# Patient Record
Sex: Male | Born: 1955 | Race: White | Hispanic: No | Marital: Single | Smoking: Current every day smoker
Health system: Southern US, Community
[De-identification: ages and names within clinical notes are randomized; demographics above are authoritative.]

## PROBLEM LIST (undated history)

## (undated) DIAGNOSIS — D3A026 Benign carcinoid tumor of the rectum: Secondary | ICD-10-CM

## (undated) DIAGNOSIS — E785 Hyperlipidemia, unspecified: Secondary | ICD-10-CM

## (undated) DIAGNOSIS — I714 Abdominal aortic aneurysm, without rupture: Secondary | ICD-10-CM

## (undated) DIAGNOSIS — I10 Essential (primary) hypertension: Secondary | ICD-10-CM

## (undated) DIAGNOSIS — T884XXA Failed or difficult intubation, initial encounter: Secondary | ICD-10-CM

## (undated) DIAGNOSIS — E559 Vitamin D deficiency, unspecified: Secondary | ICD-10-CM

---

## 2011-04-30 DIAGNOSIS — D3A026 Benign carcinoid tumor of the rectum: Secondary | ICD-10-CM

## 2011-04-30 HISTORY — DX: Benign carcinoid tumor of the rectum: D3A.026

## 2013-05-27 DIAGNOSIS — E785 Hyperlipidemia, unspecified: Secondary | ICD-10-CM

## 2013-05-27 DIAGNOSIS — E559 Vitamin D deficiency, unspecified: Secondary | ICD-10-CM

## 2013-05-27 HISTORY — DX: Vitamin D deficiency, unspecified: E55.9

## 2013-05-27 HISTORY — DX: Hyperlipidemia, unspecified: E78.5

## 2016-09-21 ENCOUNTER — Inpatient Hospital Stay (HOSPITAL_COMMUNITY): Payer: BLUE CROSS/BLUE SHIELD | Admitting: Anesthesiology

## 2016-09-21 ENCOUNTER — Emergency Department (HOSPITAL_COMMUNITY): Payer: BLUE CROSS/BLUE SHIELD

## 2016-09-21 ENCOUNTER — Inpatient Hospital Stay (HOSPITAL_COMMUNITY)
Admission: EM | Admit: 2016-09-21 | Discharge: 2016-10-08 | DRG: 329 | Disposition: A | Discharge status: Home / Self Care | Payer: BLUE CROSS/BLUE SHIELD | Attending: Surgery | Admitting: Surgery

## 2016-09-21 ENCOUNTER — Encounter (HOSPITAL_COMMUNITY): Admission: EM | Disposition: A | Discharge status: Home / Self Care | Payer: Self-pay | Source: Home / Self Care

## 2016-09-21 ENCOUNTER — Encounter (HOSPITAL_COMMUNITY): Payer: Self-pay

## 2016-09-21 DIAGNOSIS — T884XXA Failed or difficult intubation, initial encounter: Secondary | ICD-10-CM | POA: Diagnosis not present

## 2016-09-21 DIAGNOSIS — T8149XA Infection following a procedure, other surgical site, initial encounter: Secondary | ICD-10-CM

## 2016-09-21 DIAGNOSIS — K5900 Constipation, unspecified: Secondary | ICD-10-CM | POA: Diagnosis present

## 2016-09-21 DIAGNOSIS — T8143XA Infection following a procedure, organ and space surgical site, initial encounter: Secondary | ICD-10-CM

## 2016-09-21 DIAGNOSIS — Z85048 Personal history of other malignant neoplasm of rectum, rectosigmoid junction, and anus: Secondary | ICD-10-CM | POA: Diagnosis not present

## 2016-09-21 DIAGNOSIS — K651 Peritoneal abscess: Secondary | ICD-10-CM | POA: Diagnosis not present

## 2016-09-21 DIAGNOSIS — K640 First degree hemorrhoids: Secondary | ICD-10-CM | POA: Diagnosis present

## 2016-09-21 DIAGNOSIS — I10 Essential (primary) hypertension: Secondary | ICD-10-CM | POA: Diagnosis present

## 2016-09-21 DIAGNOSIS — J90 Pleural effusion, not elsewhere classified: Secondary | ICD-10-CM | POA: Diagnosis not present

## 2016-09-21 DIAGNOSIS — J189 Pneumonia, unspecified organism: Secondary | ICD-10-CM | POA: Diagnosis not present

## 2016-09-21 DIAGNOSIS — R197 Diarrhea, unspecified: Secondary | ICD-10-CM | POA: Diagnosis not present

## 2016-09-21 DIAGNOSIS — B9689 Other specified bacterial agents as the cause of diseases classified elsewhere: Secondary | ICD-10-CM | POA: Diagnosis not present

## 2016-09-21 DIAGNOSIS — Q438 Other specified congenital malformations of intestine: Secondary | ICD-10-CM

## 2016-09-21 DIAGNOSIS — Z79899 Other long term (current) drug therapy: Secondary | ICD-10-CM

## 2016-09-21 DIAGNOSIS — I739 Peripheral vascular disease, unspecified: Secondary | ICD-10-CM | POA: Diagnosis present

## 2016-09-21 DIAGNOSIS — Z9889 Other specified postprocedural states: Secondary | ICD-10-CM

## 2016-09-21 DIAGNOSIS — K3589 Other acute appendicitis: Secondary | ICD-10-CM | POA: Diagnosis not present

## 2016-09-21 DIAGNOSIS — J9811 Atelectasis: Secondary | ICD-10-CM | POA: Diagnosis not present

## 2016-09-21 DIAGNOSIS — K353 Acute appendicitis with localized peritonitis, without perforation or gangrene: Secondary | ICD-10-CM

## 2016-09-21 DIAGNOSIS — Z888 Allergy status to other drugs, medicaments and biological substances status: Secondary | ICD-10-CM | POA: Diagnosis not present

## 2016-09-21 DIAGNOSIS — Y95 Nosocomial condition: Secondary | ICD-10-CM | POA: Diagnosis not present

## 2016-09-21 DIAGNOSIS — K641 Second degree hemorrhoids: Secondary | ICD-10-CM | POA: Diagnosis present

## 2016-09-21 DIAGNOSIS — K219 Gastro-esophageal reflux disease without esophagitis: Secondary | ICD-10-CM | POA: Diagnosis present

## 2016-09-21 DIAGNOSIS — K4090 Unilateral inguinal hernia, without obstruction or gangrene, not specified as recurrent: Secondary | ICD-10-CM | POA: Diagnosis present

## 2016-09-21 DIAGNOSIS — D72829 Elevated white blood cell count, unspecified: Secondary | ICD-10-CM

## 2016-09-21 DIAGNOSIS — K567 Ileus, unspecified: Secondary | ICD-10-CM | POA: Diagnosis not present

## 2016-09-21 DIAGNOSIS — R12 Heartburn: Secondary | ICD-10-CM

## 2016-09-21 DIAGNOSIS — D3A026 Benign carcinoid tumor of the rectum: Secondary | ICD-10-CM | POA: Diagnosis present

## 2016-09-21 DIAGNOSIS — L7632 Postprocedural hematoma of skin and subcutaneous tissue following other procedure: Secondary | ICD-10-CM | POA: Diagnosis present

## 2016-09-21 DIAGNOSIS — K5909 Other constipation: Secondary | ICD-10-CM

## 2016-09-21 DIAGNOSIS — R110 Nausea: Secondary | ICD-10-CM | POA: Diagnosis not present

## 2016-09-21 DIAGNOSIS — R509 Fever, unspecified: Secondary | ICD-10-CM

## 2016-09-21 DIAGNOSIS — D72828 Other elevated white blood cell count: Secondary | ICD-10-CM

## 2016-09-21 DIAGNOSIS — E785 Hyperlipidemia, unspecified: Secondary | ICD-10-CM | POA: Diagnosis present

## 2016-09-21 DIAGNOSIS — I714 Abdominal aortic aneurysm, without rupture, unspecified: Secondary | ICD-10-CM

## 2016-09-21 DIAGNOSIS — R1013 Epigastric pain: Secondary | ICD-10-CM | POA: Diagnosis present

## 2016-09-21 DIAGNOSIS — F17210 Nicotine dependence, cigarettes, uncomplicated: Secondary | ICD-10-CM | POA: Diagnosis present

## 2016-09-21 DIAGNOSIS — K3580 Unspecified acute appendicitis: Secondary | ICD-10-CM

## 2016-09-21 DIAGNOSIS — Z720 Tobacco use: Secondary | ICD-10-CM

## 2016-09-21 HISTORY — DX: Abdominal aortic aneurysm, without rupture: I71.4

## 2016-09-21 HISTORY — DX: Essential (primary) hypertension: I10

## 2016-09-21 HISTORY — DX: Benign carcinoid tumor of the rectum: D3A.026

## 2016-09-21 HISTORY — DX: Vitamin D deficiency, unspecified: E55.9

## 2016-09-21 HISTORY — DX: Hyperlipidemia, unspecified: E78.5

## 2016-09-21 HISTORY — DX: Failed or difficult intubation, initial encounter: T88.4XXA

## 2016-09-21 HISTORY — PX: LAPAROSCOPIC APPENDECTOMY: SHX408

## 2016-09-21 HISTORY — DX: Abdominal aortic aneurysm, without rupture, unspecified: I71.40

## 2016-09-21 LAB — CBC WITH DIFFERENTIAL/PLATELET
BASOS ABS: 0.1 10*3/uL (ref 0.0–0.1)
BASOS PCT: 1 %
EOS ABS: 0.2 10*3/uL (ref 0.0–0.7)
Eosinophils Relative: 1 %
HEMATOCRIT: 39.5 % (ref 39.0–52.0)
HEMOGLOBIN: 13.6 g/dL (ref 13.0–17.0)
Lymphocytes Relative: 16 %
Lymphs Abs: 3.2 10*3/uL (ref 0.7–4.0)
MCH: 31 pg (ref 26.0–34.0)
MCHC: 34.4 g/dL (ref 30.0–36.0)
MCV: 90 fL (ref 78.0–100.0)
MONOS PCT: 7 %
Monocytes Absolute: 1.4 10*3/uL — ABNORMAL HIGH (ref 0.1–1.0)
NEUTROS ABS: 15.5 10*3/uL — AB (ref 1.7–7.7)
NEUTROS PCT: 77 %
Platelets: 337 10*3/uL (ref 150–400)
RBC: 4.39 MIL/uL (ref 4.22–5.81)
RDW: 12.9 % (ref 11.5–15.5)
WBC: 20.2 10*3/uL — AB (ref 4.0–10.5)

## 2016-09-21 LAB — COMPREHENSIVE METABOLIC PANEL
ALBUMIN: 4.2 g/dL (ref 3.5–5.0)
ALK PHOS: 95 U/L (ref 38–126)
ALT: 20 U/L (ref 17–63)
ANION GAP: 7 (ref 5–15)
AST: 27 U/L (ref 15–41)
BILIRUBIN TOTAL: 0.8 mg/dL (ref 0.3–1.2)
BUN: 13 mg/dL (ref 6–20)
CALCIUM: 8.7 mg/dL — AB (ref 8.9–10.3)
CO2: 26 mmol/L (ref 22–32)
Chloride: 104 mmol/L (ref 101–111)
Creatinine, Ser: 1.02 mg/dL (ref 0.61–1.24)
GFR calc Af Amer: >60 mL/min (ref >60)
GFR calc non Af Amer: >60 mL/min (ref >60)
GLUCOSE: 149 mg/dL — AB (ref 65–99)
Potassium: 4 mmol/L (ref 3.5–5.1)
SODIUM: 137 mmol/L (ref 135–145)
Total Protein: 7.1 g/dL (ref 6.5–8.1)

## 2016-09-21 LAB — TROPONIN I

## 2016-09-21 LAB — LIPASE, BLOOD: Lipase: 30 U/L (ref 11–51)

## 2016-09-21 LAB — I-STAT CG4 LACTIC ACID, ED: Lactic Acid, Venous: 1.29 mmol/L (ref 0.5–1.9)

## 2016-09-21 SURGERY — APPENDECTOMY, LAPAROSCOPIC
Anesthesia: General | Site: Abdomen

## 2016-09-21 MED ORDER — DEXTROSE 5 % IV SOLN
2.0000 g | INTRAVENOUS | Status: AC
  Administered 2016-09-21: 2 g via INTRAVENOUS
  Filled 2016-09-21: qty 2

## 2016-09-21 MED ORDER — LACTATED RINGERS IV BOLUS (SEPSIS)
1000.0000 mL | Freq: Once | INTRAVENOUS | Status: AC
  Administered 2016-09-21: 1000 mL via INTRAVENOUS

## 2016-09-21 MED ORDER — BUPIVACAINE HCL (PF) 0.5 % IJ SOLN
INTRAMUSCULAR | Status: AC
  Filled 2016-09-21: qty 30

## 2016-09-21 MED ORDER — IOPAMIDOL (ISOVUE-300) INJECTION 61%
100.0000 mL | Freq: Once | INTRAVENOUS | Status: AC | PRN
  Administered 2016-09-21: 100 mL via INTRAVENOUS

## 2016-09-21 MED ORDER — HYDRALAZINE HCL 20 MG/ML IJ SOLN: 5.0000 mg | Freq: Four times a day (QID) | INTRAMUSCULAR | Status: DC | PRN

## 2016-09-21 MED ORDER — SODIUM CHLORIDE 0.9 % IV BOLUS (SEPSIS)
1000.0000 mL | Freq: Once | INTRAVENOUS | Status: AC
  Administered 2016-09-21: 1000 mL via INTRAVENOUS

## 2016-09-21 MED ORDER — PROPOFOL 10 MG/ML IV BOLUS
INTRAVENOUS | Status: AC
  Filled 2016-09-21: qty 20

## 2016-09-21 MED ORDER — PROPOFOL 10 MG/ML IV BOLUS
INTRAVENOUS | Status: DC | PRN
  Administered 2016-09-21: 200 mg via INTRAVENOUS

## 2016-09-21 MED ORDER — METOCLOPRAMIDE HCL 5 MG/ML IJ SOLN: 5.0000 mg | Freq: Four times a day (QID) | INTRAMUSCULAR | Status: DC | PRN

## 2016-09-21 MED ORDER — ONDANSETRON HCL 4 MG/2ML IJ SOLN
INTRAMUSCULAR | Status: DC | PRN
  Administered 2016-09-21: 4 mg via INTRAVENOUS

## 2016-09-21 MED ORDER — HYDROMORPHONE HCL 1 MG/ML IJ SOLN: 0.2500 mg | INTRAMUSCULAR | Status: DC | PRN

## 2016-09-21 MED ORDER — PROCHLORPERAZINE EDISYLATE 5 MG/ML IJ SOLN: 5.0000 mg | INTRAMUSCULAR | Status: DC | PRN

## 2016-09-21 MED ORDER — LIDOCAINE 2% (20 MG/ML) 5 ML SYRINGE
INTRAMUSCULAR | Status: DC | PRN
  Administered 2016-09-21: 100 mg via INTRAVENOUS

## 2016-09-21 MED ORDER — DIPHENHYDRAMINE HCL 12.5 MG/5ML PO ELIX: 12.5000 mg | ORAL_SOLUTION | Freq: Four times a day (QID) | ORAL | Status: DC | PRN

## 2016-09-21 MED ORDER — DEXAMETHASONE SODIUM PHOSPHATE 10 MG/ML IJ SOLN
INTRAMUSCULAR | Status: DC | PRN
  Administered 2016-09-21: 10 mg via INTRAVENOUS

## 2016-09-21 MED ORDER — SIMETHICONE 80 MG PO CHEW: 40.0000 mg | CHEWABLE_TABLET | Freq: Four times a day (QID) | ORAL | Status: DC | PRN

## 2016-09-21 MED ORDER — CEFTRIAXONE SODIUM 2 G IJ SOLR
2.0000 g | INTRAMUSCULAR | Status: DC
  Administered 2016-09-21: 2 g via INTRAVENOUS
  Filled 2016-09-21 (×3): qty 2

## 2016-09-21 MED ORDER — SODIUM CHLORIDE 0.9 % IV SOLN: 8.0000 mg | Freq: Four times a day (QID) | INTRAVENOUS | Status: DC | PRN

## 2016-09-21 MED ORDER — PHENYLEPHRINE 40 MCG/ML (10ML) SYRINGE FOR IV PUSH (FOR BLOOD PRESSURE SUPPORT)
PREFILLED_SYRINGE | INTRAVENOUS | Status: DC | PRN
  Administered 2016-09-21 (×2): 80 ug via INTRAVENOUS

## 2016-09-21 MED ORDER — SODIUM CHLORIDE 0.9 % IJ SOLN
INTRAMUSCULAR | Status: AC
  Filled 2016-09-21: qty 50

## 2016-09-21 MED ORDER — OXYCODONE HCL 5 MG PO TABS
5.0000 mg | ORAL_TABLET | ORAL | Status: DC | PRN
  Administered 2016-09-22: 10 mg via ORAL
  Administered 2016-09-22 (×2): 5 mg via ORAL
  Administered 2016-09-22 – 2016-09-27 (×9): 10 mg via ORAL
  Administered 2016-09-30 – 2016-10-02 (×7): 5 mg via ORAL
  Administered 2016-10-02 – 2016-10-03 (×4): 10 mg via ORAL
  Administered 2016-10-03 – 2016-10-04 (×4): 5 mg via ORAL
  Administered 2016-10-05 – 2016-10-07 (×9): 10 mg via ORAL
  Administered 2016-10-07: 5 mg via ORAL
  Administered 2016-10-07 – 2016-10-08 (×3): 10 mg via ORAL
  Filled 2016-09-21 (×15): qty 2
  Filled 2016-09-21: qty 1
  Filled 2016-09-21 (×3): qty 2
  Filled 2016-09-21 (×2): qty 1
  Filled 2016-09-21: qty 2
  Filled 2016-09-21: qty 1
  Filled 2016-09-21: qty 2
  Filled 2016-09-21: qty 1
  Filled 2016-09-21: qty 2
  Filled 2016-09-21 (×2): qty 1
  Filled 2016-09-21: qty 2
  Filled 2016-09-21 (×3): qty 1
  Filled 2016-09-21: qty 2
  Filled 2016-09-21: qty 1
  Filled 2016-09-21: qty 2
  Filled 2016-09-21: qty 1
  Filled 2016-09-21: qty 2
  Filled 2016-09-21: qty 1
  Filled 2016-09-21 (×2): qty 2
  Filled 2016-09-21: qty 1

## 2016-09-21 MED ORDER — SUGAMMADEX SODIUM 200 MG/2ML IV SOLN
INTRAVENOUS | Status: DC | PRN
  Administered 2016-09-21: 200 mg via INTRAVENOUS

## 2016-09-21 MED ORDER — ACETAMINOPHEN 325 MG PO TABS: 325.0000 mg | ORAL_TABLET | Freq: Four times a day (QID) | ORAL | Status: DC | PRN

## 2016-09-21 MED ORDER — MAGIC MOUTHWASH: 15.0000 mL | Freq: Four times a day (QID) | ORAL | Status: DC | PRN

## 2016-09-21 MED ORDER — ROCURONIUM BROMIDE 10 MG/ML (PF) SYRINGE
PREFILLED_SYRINGE | INTRAVENOUS | Status: DC | PRN
  Administered 2016-09-21: 40 mg via INTRAVENOUS

## 2016-09-21 MED ORDER — LIDOCAINE 2% (20 MG/ML) 5 ML SYRINGE
INTRAMUSCULAR | Status: AC
  Filled 2016-09-21: qty 5

## 2016-09-21 MED ORDER — ONDANSETRON HCL 4 MG/2ML IJ SOLN
4.0000 mg | Freq: Four times a day (QID) | INTRAMUSCULAR | Status: DC | PRN
Start: 2016-09-21 — End: 2016-10-08

## 2016-09-21 MED ORDER — CEFOTETAN DISODIUM-DEXTROSE 2-2.08 GM-% IV SOLR
INTRAVENOUS | Status: AC
  Filled 2016-09-21: qty 50

## 2016-09-21 MED ORDER — SODIUM CHLORIDE 0.9 % IV SOLN
Freq: Once | INTRAVENOUS | Status: AC
  Administered 2016-09-21: 11:00:00 via INTRAVENOUS

## 2016-09-21 MED ORDER — SUCCINYLCHOLINE CHLORIDE 200 MG/10ML IV SOSY
PREFILLED_SYRINGE | INTRAVENOUS | Status: DC | PRN
Start: 2016-09-21 — End: 2016-09-21
  Administered 2016-09-21: 140 mg via INTRAVENOUS
  Administered 2016-09-21: 60 mg via INTRAVENOUS

## 2016-09-21 MED ORDER — DEXAMETHASONE SODIUM PHOSPHATE 10 MG/ML IJ SOLN
INTRAMUSCULAR | Status: AC
  Filled 2016-09-21: qty 1

## 2016-09-21 MED ORDER — CELECOXIB 200 MG PO CAPS: 400.0000 mg | ORAL_CAPSULE | ORAL | Status: DC

## 2016-09-21 MED ORDER — ONDANSETRON HCL 4 MG/2ML IJ SOLN
INTRAMUSCULAR | Status: AC
  Filled 2016-09-21: qty 2

## 2016-09-21 MED ORDER — SODIUM CHLORIDE 0.9 % IV SOLN: 250.0000 mL | INTRAVENOUS | Status: DC | PRN

## 2016-09-21 MED ORDER — HYDROMORPHONE HCL 1 MG/ML IJ SOLN
0.5000 mg | INTRAMUSCULAR | Status: DC | PRN
  Administered 2016-09-21: 1 mg via INTRAVENOUS
  Administered 2016-09-24: 2 mg via INTRAVENOUS
  Administered 2016-09-25 – 2016-09-26 (×4): 1 mg via INTRAVENOUS
  Filled 2016-09-21 (×3): qty 1
  Filled 2016-09-21: qty 2
  Filled 2016-09-21: qty 1
  Filled 2016-09-21: qty 2

## 2016-09-21 MED ORDER — ONDANSETRON 4 MG PO TBDP: 4.0000 mg | ORAL_TABLET | Freq: Four times a day (QID) | ORAL | Status: DC | PRN

## 2016-09-21 MED ORDER — NAPROXEN 500 MG PO TABS: 500.0000 mg | ORAL_TABLET | Freq: Two times a day (BID) | ORAL | 1 refills | Status: AC | PRN

## 2016-09-21 MED ORDER — NICOTINE 21 MG/24HR TD PT24
21.0000 mg | MEDICATED_PATCH | Freq: Every day | TRANSDERMAL | Status: DC
  Administered 2016-09-22 – 2016-10-08 (×17): 21 mg via TRANSDERMAL
  Filled 2016-09-21 (×17): qty 1

## 2016-09-21 MED ORDER — MENTHOL 3 MG MT LOZG: 1.0000 | LOZENGE | OROMUCOSAL | Status: DC | PRN

## 2016-09-21 MED ORDER — BISACODYL 10 MG RE SUPP: 10.0000 mg | Freq: Two times a day (BID) | RECTAL | Status: DC | PRN

## 2016-09-21 MED ORDER — GABAPENTIN 300 MG PO CAPS
300.0000 mg | ORAL_CAPSULE | ORAL | Status: AC
  Administered 2016-09-21: 300 mg via ORAL
  Filled 2016-09-21 (×2): qty 1

## 2016-09-21 MED ORDER — ACETAMINOPHEN 650 MG RE SUPP: 650.0000 mg | Freq: Four times a day (QID) | RECTAL | Status: DC | PRN

## 2016-09-21 MED ORDER — SODIUM CHLORIDE 0.9% FLUSH
3.0000 mL | Freq: Two times a day (BID) | INTRAVENOUS | Status: DC
  Administered 2016-09-22 – 2016-09-27 (×2): 3 mL via INTRAVENOUS

## 2016-09-21 MED ORDER — NAPROXEN 500 MG PO TABS: 500.0000 mg | ORAL_TABLET | Freq: Two times a day (BID) | ORAL | Status: DC | PRN

## 2016-09-21 MED ORDER — METOPROLOL TARTRATE 5 MG/5ML IV SOLN: 5.0000 mg | Freq: Four times a day (QID) | INTRAVENOUS | Status: DC | PRN

## 2016-09-21 MED ORDER — LACTATED RINGERS IR SOLN
Status: DC | PRN
  Administered 2016-09-21: 3000 mL

## 2016-09-21 MED ORDER — KETOROLAC TROMETHAMINE 30 MG/ML IJ SOLN
INTRAMUSCULAR | Status: AC
  Filled 2016-09-21: qty 1

## 2016-09-21 MED ORDER — LACTATED RINGERS IV SOLN
INTRAVENOUS | Status: DC | PRN
  Administered 2016-09-21 (×2): via INTRAVENOUS

## 2016-09-21 MED ORDER — SUGAMMADEX SODIUM 200 MG/2ML IV SOLN
INTRAVENOUS | Status: AC
  Filled 2016-09-21: qty 2

## 2016-09-21 MED ORDER — ACETAMINOPHEN 500 MG PO TABS
1000.0000 mg | ORAL_TABLET | ORAL | Status: AC
  Administered 2016-09-21: 1000 mg via ORAL
  Filled 2016-09-21 (×2): qty 2

## 2016-09-21 MED ORDER — PROMETHAZINE HCL 25 MG/ML IJ SOLN: 6.2500 mg | INTRAMUSCULAR | Status: DC | PRN

## 2016-09-21 MED ORDER — SODIUM CHLORIDE 0.9% FLUSH: 3.0000 mL | INTRAVENOUS | Status: DC | PRN

## 2016-09-21 MED ORDER — LACTATED RINGERS IV BOLUS (SEPSIS): 1000.0000 mL | Freq: Three times a day (TID) | INTRAVENOUS | Status: AC | PRN

## 2016-09-21 MED ORDER — SUCCINYLCHOLINE CHLORIDE 200 MG/10ML IV SOSY
PREFILLED_SYRINGE | INTRAVENOUS | Status: AC
  Filled 2016-09-21: qty 10

## 2016-09-21 MED ORDER — FENTANYL CITRATE (PF) 250 MCG/5ML IJ SOLN
INTRAMUSCULAR | Status: AC
  Filled 2016-09-21: qty 5

## 2016-09-21 MED ORDER — DIPHENHYDRAMINE HCL 50 MG/ML IJ SOLN: 12.5000 mg | Freq: Four times a day (QID) | INTRAMUSCULAR | Status: DC | PRN

## 2016-09-21 MED ORDER — METHOCARBAMOL 1000 MG/10ML IJ SOLN: 1000.0000 mg | Freq: Four times a day (QID) | INTRAVENOUS | Status: DC | PRN

## 2016-09-21 MED ORDER — FENTANYL CITRATE (PF) 100 MCG/2ML IJ SOLN
INTRAMUSCULAR | Status: DC | PRN
  Administered 2016-09-21 (×2): 50 ug via INTRAVENOUS
  Administered 2016-09-21: 150 ug via INTRAVENOUS

## 2016-09-21 MED ORDER — AMLODIPINE BESYLATE 5 MG PO TABS
5.0000 mg | ORAL_TABLET | Freq: Every day | ORAL | Status: DC
Start: 2016-09-21 — End: 2016-10-08
  Administered 2016-09-21 – 2016-10-08 (×17): 5 mg via ORAL
  Filled 2016-09-21 (×17): qty 1

## 2016-09-21 MED ORDER — LOSARTAN POTASSIUM 50 MG PO TABS
50.0000 mg | ORAL_TABLET | Freq: Every day | ORAL | Status: DC
  Administered 2016-09-21 – 2016-10-08 (×17): 50 mg via ORAL
  Filled 2016-09-21 (×17): qty 1

## 2016-09-21 MED ORDER — ACETAMINOPHEN 500 MG PO TABS
1000.0000 mg | ORAL_TABLET | Freq: Three times a day (TID) | ORAL | Status: DC
  Administered 2016-09-22: 1000 mg via ORAL
  Filled 2016-09-21: qty 2

## 2016-09-21 MED ORDER — HYDROMORPHONE HCL 1 MG/ML IJ SOLN
1.0000 mg | Freq: Once | INTRAMUSCULAR | Status: AC
  Administered 2016-09-21: 1 mg via INTRAVENOUS
  Filled 2016-09-21: qty 1

## 2016-09-21 MED ORDER — 0.9 % SODIUM CHLORIDE (POUR BTL) OPTIME
TOPICAL | Status: DC | PRN
  Administered 2016-09-21: 1000 mL

## 2016-09-21 MED ORDER — ONDANSETRON HCL 4 MG/2ML IJ SOLN
4.0000 mg | Freq: Once | INTRAMUSCULAR | Status: AC
  Administered 2016-09-21: 4 mg via INTRAVENOUS
  Filled 2016-09-21: qty 2

## 2016-09-21 MED ORDER — IOPAMIDOL (ISOVUE-300) INJECTION 61%
INTRAVENOUS | Status: AC
  Filled 2016-09-21: qty 100

## 2016-09-21 MED ORDER — ROCURONIUM BROMIDE 50 MG/5ML IV SOSY
PREFILLED_SYRINGE | INTRAVENOUS | Status: AC
  Filled 2016-09-21: qty 5

## 2016-09-21 MED ORDER — CHLORHEXIDINE GLUCONATE CLOTH 2 % EX PADS: 6.0000 | MEDICATED_PAD | Freq: Once | CUTANEOUS | Status: AC

## 2016-09-21 MED ORDER — HYDRALAZINE HCL 20 MG/ML IJ SOLN: 10.0000 mg | INTRAMUSCULAR | Status: DC | PRN

## 2016-09-21 MED ORDER — KETOROLAC TROMETHAMINE 30 MG/ML IJ SOLN
INTRAMUSCULAR | Status: DC | PRN
  Administered 2016-09-21: 30 mg via INTRAVENOUS

## 2016-09-21 MED ORDER — OXYCODONE HCL 5 MG PO TABS: 5.0000 mg | ORAL_TABLET | ORAL | 0 refills | Status: AC | PRN

## 2016-09-21 MED ORDER — DEXTROSE 5 % IV SOLN
2.0000 g | INTRAVENOUS | Status: DC
  Filled 2016-09-21: qty 2

## 2016-09-21 MED ORDER — LABETALOL HCL 5 MG/ML IV SOLN
INTRAVENOUS | Status: DC | PRN
  Administered 2016-09-21: 5 mg via INTRAVENOUS

## 2016-09-21 MED ORDER — ACETAMINOPHEN 325 MG PO TABS: 650.0000 mg | ORAL_TABLET | Freq: Four times a day (QID) | ORAL | Status: DC | PRN

## 2016-09-21 MED ORDER — LACTATED RINGERS IV SOLN
INTRAVENOUS | Status: DC
  Administered 2016-09-22 – 2016-09-24 (×4): via INTRAVENOUS

## 2016-09-21 MED ORDER — PHENOL 1.4 % MT LIQD: 2.0000 | OROMUCOSAL | Status: DC | PRN

## 2016-09-21 MED ORDER — LABETALOL HCL 5 MG/ML IV SOLN
INTRAVENOUS | Status: AC
  Filled 2016-09-21: qty 4

## 2016-09-21 MED ORDER — LIP MEDEX EX OINT
1.0000 "application " | TOPICAL_OINTMENT | Freq: Two times a day (BID) | CUTANEOUS | Status: DC
  Administered 2016-09-22 – 2016-10-08 (×27): 1 via TOPICAL
  Filled 2016-09-21 (×2): qty 7

## 2016-09-21 MED ORDER — CHLORHEXIDINE GLUCONATE CLOTH 2 % EX PADS
6.0000 | MEDICATED_PAD | Freq: Once | CUTANEOUS | Status: AC
  Administered 2016-09-21: 6 via TOPICAL

## 2016-09-21 MED ORDER — ALUM & MAG HYDROXIDE-SIMETH 200-200-20 MG/5ML PO SUSP
30.0000 mL | Freq: Four times a day (QID) | ORAL | Status: DC | PRN
  Administered 2016-09-22: 30 mL via ORAL
  Filled 2016-09-21 (×2): qty 30

## 2016-09-21 MED ORDER — ONDANSETRON HCL 4 MG/2ML IJ SOLN: 4.0000 mg | Freq: Four times a day (QID) | INTRAMUSCULAR | Status: DC | PRN

## 2016-09-21 MED ORDER — ENOXAPARIN SODIUM 40 MG/0.4ML ~~LOC~~ SOLN
40.0000 mg | SUBCUTANEOUS | Status: DC
  Administered 2016-09-22 – 2016-10-03 (×11): 40 mg via SUBCUTANEOUS
  Filled 2016-09-21 (×11): qty 0.4

## 2016-09-21 MED ORDER — BUPIVACAINE HCL 0.5 % IJ SOLN
INTRAMUSCULAR | Status: DC | PRN
  Administered 2016-09-21: 30 mL
  Administered 2016-09-21: 25 mL

## 2016-09-21 SURGICAL SUPPLY — 43 items
APPLIER CLIP 5 13 M/L LIGAMAX5 (MISCELLANEOUS)
APPLIER CLIP ROT 10 11.4 M/L (STAPLE)
CABLE HIGH FREQUENCY MONO STRZ (ELECTRODE) ×3 IMPLANT
CHLORAPREP W/TINT 26ML (MISCELLANEOUS) ×3 IMPLANT
CLIP APPLIE 5 13 M/L LIGAMAX5 (MISCELLANEOUS) IMPLANT
CLIP APPLIE ROT 10 11.4 M/L (STAPLE) IMPLANT
COVER SURGICAL LIGHT HANDLE (MISCELLANEOUS) ×3 IMPLANT
CUTTER FLEX LINEAR 45M (STAPLE) ×3 IMPLANT
DECANTER SPIKE VIAL GLASS SM (MISCELLANEOUS) ×3 IMPLANT
DEVICE TROCAR PUNCTURE CLOSURE (ENDOMECHANICALS) ×3 IMPLANT
DRAPE LAPAROSCOPIC ABDOMINAL (DRAPES) ×3 IMPLANT
DRAPE WARM FLUID 44X44 (DRAPE) ×6 IMPLANT
DRSG TEGADERM 2-3/8X2-3/4 SM (GAUZE/BANDAGES/DRESSINGS) ×6 IMPLANT
DRSG TEGADERM 4X4.75 (GAUZE/BANDAGES/DRESSINGS) ×3 IMPLANT
ELECT REM PT RETURN 9FT ADLT (ELECTROSURGICAL) ×3
ELECTRODE REM PT RTRN 9FT ADLT (ELECTROSURGICAL) ×1 IMPLANT
ENDOLOOP SUT PDS II  0 18 (SUTURE)
ENDOLOOP SUT PDS II 0 18 (SUTURE) IMPLANT
GAUZE SPONGE 2X2 8PLY STRL LF (GAUZE/BANDAGES/DRESSINGS) ×1 IMPLANT
GLOVE ECLIPSE 8.0 STRL XLNG CF (GLOVE) ×3 IMPLANT
GLOVE INDICATOR 8.0 STRL GRN (GLOVE) ×3 IMPLANT
GOWN STRL REUS W/TWL XL LVL3 (GOWN DISPOSABLE) ×6 IMPLANT
IRRIG SUCT STRYKERFLOW 2 WTIP (MISCELLANEOUS) ×3
IRRIGATION SUCT STRKRFLW 2 WTP (MISCELLANEOUS) ×1 IMPLANT
KIT BASIN OR (CUSTOM PROCEDURE TRAY) ×3 IMPLANT
PAD POSITIONING PINK XL (MISCELLANEOUS) ×3 IMPLANT
POUCH SPECIMEN RETRIEVAL 10MM (ENDOMECHANICALS) ×3 IMPLANT
RELOAD 45 VASCULAR/THIN (ENDOMECHANICALS) IMPLANT
RELOAD STAPLE TA45 3.5 REG BLU (ENDOMECHANICALS) ×3 IMPLANT
SCISSORS LAP 5X35 DISP (ENDOMECHANICALS) ×3 IMPLANT
SHEARS HARMONIC ACE PLUS 36CM (ENDOMECHANICALS) ×3 IMPLANT
SLEEVE XCEL OPT CAN 5 100 (ENDOMECHANICALS) ×3 IMPLANT
SPONGE GAUZE 2X2 STER 10/PKG (GAUZE/BANDAGES/DRESSINGS) ×2
SUT MNCRL AB 4-0 PS2 18 (SUTURE) ×3 IMPLANT
SUT PDS AB 0 CT1 36 (SUTURE) IMPLANT
SUT PDS AB 1 CT1 27 (SUTURE) IMPLANT
SUT SILK 2 0 SH (SUTURE) IMPLANT
TOWEL OR 17X26 10 PK STRL BLUE (TOWEL DISPOSABLE) ×3 IMPLANT
TRAY FOLEY W/METER SILVER 16FR (SET/KITS/TRAYS/PACK) ×3 IMPLANT
TRAY LAPAROSCOPIC (CUSTOM PROCEDURE TRAY) ×3 IMPLANT
TROCAR BLADELESS OPT 5 100 (ENDOMECHANICALS) ×3 IMPLANT
TROCAR XCEL 12X100 BLDLESS (ENDOMECHANICALS) ×3 IMPLANT
TUBING INSUF HEATED (TUBING) ×3 IMPLANT

## 2016-09-21 NOTE — ED Notes (Signed)
Spoke with pharmacy, all antibiotics ordered for pt are to be administered in the OR.

## 2016-09-21 NOTE — ED Notes (Signed)
Bed: WA02 Expected date: 09/21/16 Expected time: 8:49 AM Means of arrival: Ambulance Comments: abd pain

## 2016-09-21 NOTE — H&P (Signed)
[name]  [address] [name], [place] [address] Phone: [phone] FAX: [phone]     Devin Goodwin  December 28, 1955 [id number]  This patient is a 60 y.o.male who presents today for surgical evaluation at the request of Dr Duffy Bruce.   Reason for evaluation: Acute appendicitis  Smoking male.  Trucker.  Noticed worsening pain starting this morning.  Became unbearable.  Came to emergency room.  Decreased appetite.  Nauseated.  Never had anything like this before.  Brother at bedside.  Pain became more focal right lower abdomen.  CT scan confirmed suspicion of appendicitis.  Surgical consultation requested.  Patient has some constipation, moving his bowels about twice a week.  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  This was in 2012.  Sent to see colorectal surgeon Dr Toy Cookey that saw just a scar.  Recommended endoscopic follow-up in 3-6 months.  Recommended consider colonoscopy in a year.  That never happened.  No personal nor family history of GI/colon cancer, inflammatory bowel disease, irritable bowel syndrome, allergy such as Celiac Sprue, dietary/dairy problems, colitis, ulcers nor gastritis.  No recent sick contacts/gastroenteritis.  No travel outside the country.  No changes in diet.  No dysphagia to solids or liquids.  No significant heartburn or reflux.  No hematochezia, hematemesis, coffee ground emesis.  No evidence of prior gastric/peptic ulceration.    Assessment  [name]  60 y.o. male  Day of Surgery  Procedure(s): APPENDECTOMY LAPAROSCOPIC  Problem List:  Principal Problem:   Acute appendicitis Active Problems:   AAA (abdominal aortic aneurysm)    Essential hypertension   Hyperlipidemia   Tobacco abuse   Constipation, chronic   Acute appendicitis by exam and CT scan  Plan:  Admit.  IV antibiotics.  IV fluids.  Pain and nausea  control.  Diagnostic laparoscopy with probable appendectomy:  The anatomy & physiology of the digestive tract was discussed.  The pathophysiology of appendicitis and other appendiceal disorders were discussed.  Natural history risks without surgery was discussed.   I feel the risks of no intervention will lead to serious problems that outweigh the operative risks; therefore, I recommended diagnostic laparoscopy with removal of appendix to remove the pathology.  Laparoscopic & open techniques were discussed.   I noted a good likelihood this will help address the problem.   Risks such as bleeding, infection, abscess, leak, reoperation, injury to other organs, need for repair of tissues / organs, possible ostomy, hernia, heart attack, stroke, death, and other risks were discussed.  Goals of post-operative recovery were discussed as well.  We will work to minimize complications.  Questions were answered.  The patient expresses understanding & wishes to proceed with surgery.  -HTN control -Mildly enlarged abdominal aorta.  Do not know if it is truly an aneurysm but on its way.  Defer outpatient follow-up.  Recommended consideration of examination under anesthesia to make sure that he does not have any recurrence in his anorectal region given his history of carcinoid.  He is open to considering it.  The anatomy & physiology of the anorectal region was discussed.  The pathophysiology of carcinoid tumor and   hemorrhoids and differential diagnosis was discussed.  Natural history risks without surgery was discussed.   I stressed the importance of a bowel regimen to have daily soft bowel movements to minimize progression of disease.    The patient's symptoms are not adequately controlled by medicines and other  non-operative treatments.  I feel the risks & problems of no surgery outweigh the operative risks; therefore, I recommended surgery to examine the perianal and rectum region under anesthesia to rule out  recurrence of carcinoid,  possible resection.  Risks such as bleeding, infection, urinary difficulties, injury to other organs, need for repair of tissues / organs, need for further treatment, heart attack, death, and other risks were discussed.   I noted a good likelihood this will help address the problem.  Goals of post-operative recovery were discussed as well.  Possibility that this will not correct all symptoms was explained.  Post-operative pain, bleeding, constipation, and other problems after surgery were discussed.  We will work to minimize complications.   Educational handouts further explaining the pathology, treatment options, and bowel regimen were given as well.  Questions were answered.  The patient expresses understanding & wishes to proceed with surgery.   -STOP SMOKING! We talked to the patient about the dangers of smoking.  We stressed that tobacco use dramatically increases the risk of peri-operative complications such as infection, tissue necrosis leaving to problems with incision/wound and organ healing, hernia, chronic pain, heart attack, stroke, DVT, pulmonary embolism, and death.  We noted there are programs in our community to help stop smoking.  Information was available.   -VTE prophylaxis- SCDs, etc -mobilize as tolerated to help recovery    [name], M.D., F.A.C.S. Gastrointestinal and Minimally Invasive Surgery [facility] [address] [place], [place] [address] [phone] Main / Paging   09/21/2016  CARE TEAM:  PCP: Joanette Gula, MD  Outpatient Care Team: Patient Care Team: Joanette Gula, MD as PCP - General (Internal Medicine) Janie Morning, MD as Consulting Physician (Gastroenterology) Cathe Mons, MD as Consulting Physician (Colon and Rectal Surgery) Wojciech Boston, MD as Consulting Physician (General Surgery)  Inpatient Treatment Team: Treatment Team: Attending Provider: Cameron Boston, MD;  Technician: Thera Flake, NT; Registered Nurse: Braulio Conte, RN; Consulting Physician: Nolon Nations, MD; Registered Nurse: Johnanna Schneiders, RN     Past Medical History:  Diagnosis Date  . AAA (abdominal aortic aneurysm)  09/21/2016   3.3cm on CT scan 09/21/2016  . 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 Hyperlipidemia 05/27/2013  . Hypertension   . Vitamin D deficiency 05/27/2013   Overview:  10/1 IMO update    History reviewed. No pertinent surgical history.  Social History   Social History  . Marital status: Single    Spouse name: N/A  . Number of children: N/A  . Years of education: N/A   Occupational History  . Not on file.   Social History Main Topics  . Smoking status: Current Every Day Smoker    Packs/day: 0.50    Years: 40.00    Types: Cigarettes  . Smokeless tobacco: Never Used  . Alcohol use No  . Drug use: No  . Sexual activity: Not on file   Other Topics Concern  . Not on file   Social History Narrative  . No narrative on file    No family history on file.  Current Facility-Administered Medications  Medication Dose Route Frequency Provider Last Rate Last Dose  . acetaminophen (TYLENOL) tablet 650 mg  650 mg Oral Q6H PRN Jayce Boston, MD       Or  . acetaminophen (TYLENOL) suppository 650 mg  650 mg Rectal Q6H PRN Bayden Boston, MD      .  acetaminophen (TYLENOL) tablet 1,000 mg  1,000 mg Oral On Call to OR Jasman Boston, MD      . alum & mag hydroxide-simeth (MAALOX/MYLANTA) 200-200-20 MG/5ML suspension 30 mL  30 mL Oral Q6H PRN Kenston Boston, MD      . bisacodyl (DULCOLAX) suppository 10 mg  10 mg Rectal Q12H PRN Obdulio Boston, MD      . cefoTEtan (CEFOTAN) 2 g in dextrose 5 % 50 mL IVPB  2 g Intravenous On Call to OR Jj Boston, MD      . cefTRIAXone (ROCEPHIN) 2 g in dextrose 5 % 50 mL IVPB  2 g Intravenous Q24H Macintyre Boston, MD      . celecoxib (CELEBREX) capsule 400 mg  400 mg Oral  On Call to OR Jovann Boston, MD      . Chlorhexidine Gluconate Cloth 2 % PADS 6 each  6 each Topical Once Manveer Boston, MD       And  . Chlorhexidine Gluconate Cloth 2 % PADS 6 each  6 each Topical Once Candler Boston, MD      . diphenhydrAMINE (BENADRYL) 12.5 MG/5ML elixir 12.5 mg  12.5 mg Oral Q6H PRN Xylan Boston, MD       Or  . diphenhydrAMINE (BENADRYL) injection 12.5 mg  12.5 mg Intravenous Q6H PRN Jaymie Boston, MD      . Derrill Memo ON 09/22/2016] enoxaparin (LOVENOX) injection 40 mg  40 mg Subcutaneous Q24H Taishawn Boston, MD      . gabapentin (NEURONTIN) capsule 300 mg  300 mg Oral On Call to OR Winifred Boston, MD      . hydrALAZINE (APRESOLINE) injection 10 mg  10 mg Intravenous Q2H PRN Malikhi Boston, MD      . HYDROmorphone (DILAUDID) injection 0.5-2 mg  0.5-2 mg Intravenous Q1H PRN Desmen Boston, MD      . iopamidol (ISOVUE-300) 61 % injection           . lactated ringers bolus 1,000 mL  1,000 mL Intravenous Once Amun Boston, MD      . lactated ringers infusion   Intravenous Continuous Geovanni Boston, MD      . lip balm (CARMEX) ointment 1 application  1 application Topical BID Pope Boston, MD      . magic mouthwash  15 mL Oral QID PRN Nic Boston, MD      . menthol-cetylpyridinium (CEPACOL) lozenge 3 mg  1 lozenge Oral PRN Marlyn Boston, MD      . methocarbamol (ROBAXIN) 1,000 mg in dextrose 5 % 50 mL IVPB  1,000 mg Intravenous Q6H PRN Eldwin Boston, MD      . metoCLOPramide (REGLAN) injection 5-10 mg  5-10 mg Intravenous Q6H PRN Anhad Boston, MD      . metoprolol (LOPRESSOR) injection 5 mg  5 mg Intravenous Q6H PRN Raylin Boston, MD      . nicotine (NICODERM CQ - dosed in mg/24 hours) patch 21 mg  21 mg Transdermal Daily Abb Boston, MD      . ondansetron (ZOFRAN) 8 mg in sodium chloride 0.9 % 50 mL IVPB  8 mg Intravenous Q6H PRN Lucca Boston, MD      . ondansetron (ZOFRAN-ODT) disintegrating tablet 4 mg  4 mg Oral Q6H PRN Frazier Boston, MD       Or  . ondansetron [facility]) injection 4 mg  4 mg  Intravenous Q6H PRN Lenardo Boston, MD      . oxyCODONE (Oxy IR/ROXICODONE) immediate release tablet 5-10 mg  5-10 mg  Oral Q4H PRN Terez Boston, MD      . phenol [facility]) mouth spray 2 spray  2 spray Mouth/Throat PRN Gatlin Boston, MD      . prochlorperazine (COMPAZINE) injection 5-10 mg  5-10 mg Intravenous Q4H PRN Reuven Boston, MD      . simethicone [facility]) chewable tablet 40 mg  40 mg Oral Q6H PRN Jadarius Boston, MD      . sodium chloride 0.9 % injection            Current Outpatient Prescriptions  Medication Sig Dispense Refill  . amLODipine (NORVASC) 5 MG tablet Take 5 mg by mouth daily.     Marland Kitchen losartan (COZAAR) 100 MG tablet Take 100 mg by mouth daily.       Allergies  Allergen Reactions  . Lisinopril Cough  . Simvastatin Other (See Comments)    myalgia    ROS: Constitutional:  No fevers, chills, sweats.  Weight stable Eyes:  No vision changes, No discharge HENT:  No sore throats, nasal drainage Lymph: No neck swelling, No bruising easily Pulmonary:  No cough, productive sputum CV: No orthopnea, PND  Patient walks 30 minutes for about 1 miles without difficulty.  No exertional chest/neck/shoulder/arm pain. GI: No personal nor family history of GI/colon cancer ASIDE FROM RECTAL CARCINOID 2012, inflammatory bowel disease, irritable bowel syndrome, allergy such as Celiac Sprue, dietary/dairy problems, colitis, ulcers nor gastritis.  No recent sick contacts/gastroenteritis.  No travel outside the country.  No changes in diet. Renal: No UTIs, No hematuria Genital:  No drainage, bleeding, masses Musculoskeletal: No severe joint pain.  Good ROM major joints Skin:  No sores or lesions.  No rashes Heme/Lymph:  No easy bleeding.  No swollen lymph nodes Neuro: No focal weakness/numbness.  No seizures Psych: No suicidal ideation.  No hallucinations  BP 163/85 (BP Location: Right Arm)   Pulse 87   Temp 98 F (36.7 C) (Oral)   Resp 18   SpO2 96%   Physical Exam: General: Pt  awake/alert/oriented x4 in  no major acute distress Eyes: PERRL, normal EOM. Sclera nonicteric Neuro: CN II-XII intact w/o focal sensory/motor deficits. Lymph: No head/neck/groin lymphadenopathy Psych:  No delerium/psychosis/paranoia HENT: Normocephalic, Mucus membranes moist.  No thrush Neck: Supple, No tracheal deviation Chest: No pain.  Good respiratory excursion. CV:  Pulses intact.  Regular rhythm Abdomen: Obese.  Somewhat firm.  Tenderness in right lower quadrant with some involuntary guarding.  Pain with belching.  Pain with car ride.  Refused to cough.  Rest of the abdomen soft and nontender.  Only mildly distended.  No diastases.  No umbilical hernias.   Genital: No inguinal hernias or inguinal lymphadenopathy.  Normal external genitalia  Rectal: Deferred until under anesthesia.   Ext:  SCDs BLE.  No significant edema.  No cyanosis Skin: No petechiae / purpurea.  No major sores Musculoskeletal: No severe joint pain.  Good ROM major joints   Results:   Labs: Results for orders placed or performed during the hospital encounter of 09/21/16 (from the past 48 hour(s))  CBC with Differential     Status: Abnormal   Collection Time: 09/21/16  9:25 AM  Result Value Ref Range   WBC 20.2 (H) 4.0 - 10.5 K/uL   RBC 4.39 4.22 - 5.81 MIL/uL   Hemoglobin 13.6 13.0 - 17.0 g/dL   HCT 39.5 39.0 - 52.0 %   MCV 90.0 78.0 - 100.0 fL   MCH 31.0 26.0 - 34.0 pg   MCHC 34.4 30.0 -  36.0 g/dL   RDW 12.9 11.5 - 15.5 %   Platelets 337 150 - 400 K/uL   Neutrophils Relative % 77 %   Neutro Abs 15.5 (H) 1.7 - 7.7 K/uL   Lymphocytes Relative 16 %   Lymphs Abs 3.2 0.7 - 4.0 K/uL   Monocytes Relative 7 %   Monocytes Absolute 1.4 (H) 0.1 - 1.0 K/uL   Eosinophils Relative 1 %   Eosinophils Absolute 0.2 0.0 - 0.7 K/uL   Basophils Relative 1 %   Basophils Absolute 0.1 0.0 - 0.1 K/uL  Comprehensive metabolic panel     Status: Abnormal   Collection Time: 09/21/16  9:25 AM  Result Value Ref Range    Sodium 137 135 - 145 mmol/L   Potassium 4.0 3.5 - 5.1 mmol/L   Chloride 104 101 - 111 mmol/L   CO2 26 22 - 32 mmol/L   Glucose, Bld 149 (H) 65 - 99 mg/dL   BUN 13 6 - 20 mg/dL   Creatinine, Ser 1.02 0.61 - 1.24 mg/dL   Calcium 8.7 (L) 8.9 - 10.3 mg/dL   Total Protein 7.1 6.5 - 8.1 g/dL   Albumin 4.2 3.5 - 5.0 g/dL   AST 27 15 - 41 U/L   ALT 20 17 - 63 U/L   Alkaline Phosphatase 95 38 - 126 U/L   Total Bilirubin 0.8 0.3 - 1.2 mg/dL   GFR calc non Af Amer >60 >60 mL/min   GFR calc Af Amer >60 >60 mL/min    Comment: (NOTE) The eGFR has been calculated using the CKD EPI equation. This calculation has not been validated in all clinical situations. eGFR's persistently <60 mL/min signify possible Chronic Kidney Disease.    Anion gap 7 5 - 15  Lipase, blood     Status: None   Collection Time: 09/21/16  9:25 AM  Result Value Ref Range   Lipase 30 11 - 51 U/L  Troponin I     Status: None   Collection Time: 09/21/16  9:25 AM  Result Value Ref Range   Troponin I <0.03 <0.03 ng/mL  I-Stat CG4 Lactic Acid, ED     Status: None   Collection Time: 09/21/16  9:47 AM  Result Value Ref Range   Lactic Acid, Venous 1.29 0.5 - 1.9 mmol/L    Imaging / Studies: Ct Abdomen Pelvis W Contrast  Result Date: 09/21/2016 CLINICAL DATA:  Periumbilical pain EXAM: CT ABDOMEN AND PELVIS WITH CONTRAST TECHNIQUE: Multidetector CT imaging of the abdomen and pelvis was performed using the standard protocol following bolus administration of intravenous contrast. CONTRAST:  142m ISOVUE-300 IOPAMIDOL (ISOVUE-300) INJECTION 61% COMPARISON:  None. FINDINGS: Lower chest: Mild dependent atelectatic changes are noted. Hepatobiliary: Diffuse fatty infiltration of the liver is noted. The gallbladder is within normal limits. Pancreas: Unremarkable. No pancreatic ductal dilatation or surrounding inflammatory changes. Spleen: Normal in size without focal abnormality. Adrenals/Urinary Tract: Renal glands are within normal  limits. Tiny right renal cyst is noted. No obstructive changes are seen. The bladder is well distended. Stomach/Bowel: Small sliding-type hiatal hernia is noted. No obstructive changes are noted within the bowel. The appendix is well visualized and demonstrates a few small appendicoliths. Mild dilatation of the appendix to 11 mm is noted. Minimal inflammatory changes are noted distally. These changes could represent some very early appendicitis. Vascular/Lymphatic: Mild dilatation of the abdominal aorta is noted to 3.3 cm. No obstructive changes are seen. Scattered small lymph nodes are identified in the iliac chain as well as  in the periaortic and pericaval region. Reproductive: Prostate is unremarkable. Other: No abdominal wall hernia or abnormality. No abdominopelvic ascites. Musculoskeletal: No acute or significant osseous findings. IMPRESSION: Mild dilatation of the appendix with very minimal inflammatory changes. Given the patient's elevated white blood cell count this may represent very early appendicitis. Dilatation of the infrarenal aorta as described. Scattered lymph nodes are noted in the retroperitoneum as well as in the iliac chains bilaterally. The majority of these measure 1 cm or less. Electronically Signed   By: Inez Catalina M.D.   On: 09/21/2016 10:52    Medications / Allergies: per chart  Antibiotics: Anti-infectives    Start     Dose/Rate Route Frequency Ordered Stop   09/21/16 1145  cefTRIAXone (ROCEPHIN) 2 g in dextrose 5 % 50 mL IVPB    Comments:  Pharmacy may adjust dosing strength / duration / interval for maximal efficacy   2 g 100 mL/hr over 30 Minutes Intravenous Every 24 hours 09/21/16 1136     09/21/16 1130  cefoTEtan (CEFOTAN) 2 g in dextrose 5 % 50 mL IVPB     2 g 100 mL/hr over 30 Minutes Intravenous On call to O.R. 09/21/16 1125 09/22/16 0559        Note: Portions of this report may have been transcribed using voice recognition software. Every effort was made to  ensure accuracy; however, inadvertent computerized transcription errors may be present.   Any transcriptional errors that result from this process are unintentional.

## 2016-09-21 NOTE — Anesthesia Preprocedure Evaluation (Addendum)
Anesthesia Evaluation  Patient identified by MRN, date of birth, ID band Patient awake    Reviewed: Allergy & Precautions, NPO status , Patient's Chart, lab work & pertinent test results  Airway Mallampati: III  TM Distance: >3 FB Neck ROM: Full    Dental  (+) Dental Advisory Given   Pulmonary Current Smoker,    breath sounds clear to auscultation       Cardiovascular hypertension, Pt. on medications + Peripheral Vascular Disease (3.3cm AAA)   Rhythm:Regular Rate:Normal     Neuro/Psych negative neurological ROS     GI/Hepatic Neg liver ROS, Acute appendicitis   Endo/Other  negative endocrine ROS  Renal/GU negative Renal ROS     Musculoskeletal   Abdominal   Peds  Hematology negative hematology ROS (+)   Anesthesia Other Findings   Reproductive/Obstetrics                            Lab Results  Component Value Date   WBC 20.2 (H) 09/21/2016   HGB 13.6 09/21/2016   HCT 39.5 09/21/2016   MCV 90.0 09/21/2016   PLT 337 09/21/2016   Lab Results  Component Value Date   CREATININE 1.02 09/21/2016   BUN 13 09/21/2016   NA 137 09/21/2016   K 4.0 09/21/2016   CL 104 09/21/2016   CO2 26 09/21/2016    Anesthesia Physical Anesthesia Plan  ASA: II and emergent  Anesthesia Plan: General   Post-op Pain Management:    Induction: Intravenous and Rapid sequence  Airway Management Planned: Oral ETT  Additional Equipment:   Intra-op Plan:   Post-operative Plan: Extubation in OR  Informed Consent: I have reviewed the patients History and Physical, chart, labs and discussed the procedure including the risks, benefits and alternatives for the proposed anesthesia with the patient or authorized representative who has indicated his/her understanding and acceptance.   Dental advisory given  Plan Discussed with: CRNA  Anesthesia Plan Comments:         Anesthesia Quick  Evaluation

## 2016-09-21 NOTE — Anesthesia Postprocedure Evaluation (Signed)
Anesthesia Post Note  Patient: Devin Goodwin  Procedure(s) Performed: Procedure(s) (LRB): APPENDECTOMY LAPAROSCOPIC (N/A)  Patient location during evaluation: PACU Anesthesia Type: General Level of consciousness: awake and alert Pain management: pain level controlled Vital Signs Assessment: post-procedure vital signs reviewed and stable Respiratory status: spontaneous breathing, nonlabored ventilation, respiratory function stable and patient connected to nasal cannula oxygen Cardiovascular status: blood pressure returned to baseline and stable Postop Assessment: no signs of nausea or vomiting Anesthetic complications: no    Last Vitals:  Vitals:   09/21/16 1502 09/21/16 1900  BP: 163/100 (!) 181/83  Pulse: 104 100  Resp: 16   Temp:  36.4 C    Last Pain:  Vitals:   09/21/16 1347  TempSrc:   PainSc: 9                  Tiajuana Amass

## 2016-09-21 NOTE — ED Notes (Signed)
Made  Patient aware that we need a urine sample he stated he is unable to go at this time

## 2016-09-21 NOTE — ED Triage Notes (Signed)
Per [facility]- Pt resents with sudden onset of LUQ to central stomach pain. Emesis x 1 Zofran 4mg  IVP given in route. Denies CP/SOB. Pale and diaphoretic upon arrival. Improvement with status. EDP to evaluate upon arrival

## 2016-09-21 NOTE — Transfer of Care (Signed)
Immediate Anesthesia Transfer of Care Note  Patient: Devin Goodwin  Procedure(s) Performed: Procedure(s): APPENDECTOMY LAPAROSCOPIC (N/A)  Patient Location: PACU  Anesthesia Type:General  Level of Consciousness:  sedated, patient cooperative and responds to stimulation  Airway & Oxygen Therapy:Patient Spontanous Breathing and Patient connected to face mask oxgen  Post-op Assessment:  Report given to PACU RN and Post -op Vital signs reviewed and stable  Post vital signs:  Reviewed and stable  Last Vitals:  Vitals:   09/21/16 1242 09/21/16 1502  BP: 164/96 163/100  Pulse: 100 104  Resp: 16 16  Temp: 123456 C     Complications: No apparent anesthesia complications

## 2016-09-21 NOTE — Anesthesia Procedure Notes (Signed)
Procedure Name: Intubation Date/Time: 09/21/2016 5:30 PM Performed by: Danley Danker L Patient Re-evaluated:Patient Re-evaluated prior to inductionOxygen Delivery Method: Circle system utilized Preoxygenation: Pre-oxygenation with 100% oxygen Intubation Type: IV induction and Rapid sequence Ventilation: Mask ventilation with difficulty, Two handed mask ventilation required and Oral airway inserted - appropriate to patient size Laryngoscope Size: Glidescope and 3 Grade View: Grade II Tube type: Glide rite Tube size: 7.5 mm Number of attempts: 3 Airway Equipment and Method: Bougie stylet and Video-laryngoscopy Placement Confirmation: ETT inserted through vocal cords under direct vision,  positive ETCO2 and breath sounds checked- equal and bilateral Secured at: 23 cm Tube secured with: Tape Dental Injury: Teeth and Oropharynx as per pre-operative assessment  Comments: Attempted intubation with Miller 3 x 2.  Glidescope utilized, though needed a bougie to pass through the cords as tube would not advance without it.

## 2016-09-21 NOTE — ED Provider Notes (Signed)
[facility] DEPT Provider Note   CSN: [id number] Arrival date & time: 09/21/16  [id number]     History   Chief Complaint Chief Complaint  Patient presents with  . Abdominal Pain  . Emesis    HPI Devin Goodwin is a 60 y.o. male.  HPI 60 year old male with history of hypertension here with acute onset of epigastric abdominal pain. The patient states he was feeling well last night. He went to bed and was able to sleep throughout the night. 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 He has also had multiple episodes of nonbloody, nonbilious emesis. Pain has been made worse with palpation. He has not tried to eat or drink anything. He's had no fevers or chills. Denies any history of peptic ulcer disease. He does not take aspirin or NSAIDs regularly. He has never expands anything like this before. Per EMS report, patient was nauseous and diaphoretic on their arrival, which improved with Zofran. He endorses persistent, 7-8 out of 10 abdominal pain at this time.  Past Medical History:  Diagnosis Date  . HTN (hypertension) 09/21/2016  . Hypertension     Patient Active Problem List   Diagnosis Date Noted  . Acute appendicitis 09/21/2016  . AAA (abdominal aortic aneurysm)  09/21/2016  . HTN (hypertension) 09/21/2016    History reviewed. No pertinent surgical history.     Home Medications    Prior to Admission medications   Medication Sig Start Date End Date Taking? Authorizing Provider  amLODipine (NORVASC) 5 MG tablet Take 5 mg by mouth daily.    Yes Historical Provider, MD  losartan (COZAAR) 100 MG tablet Take 100 mg by mouth daily.   Yes Historical Provider, MD    Family History No family history on file.  Social History Social History  Substance Use Topics  . Smoking status: Current Every Day Smoker  . Smokeless tobacco: Never Used  . Alcohol use  No     Allergies   Patient has no known allergies.   Review of Systems Review of Systems  Constitutional: Negative for chills, fatigue and fever.  HENT: Negative for congestion and rhinorrhea.   Eyes: Negative for visual disturbance.  Respiratory: Negative for cough, shortness of breath and wheezing.   Cardiovascular: Negative for chest pain and leg swelling.  Gastrointestinal: Positive for abdominal pain, nausea and vomiting. Negative for diarrhea.  Genitourinary: Negative for dysuria and flank pain.  Musculoskeletal: Negative for neck pain and neck stiffness.  Skin: Negative for rash and wound.  Allergic/Immunologic: Negative for immunocompromised state.  Neurological: Negative for syncope, weakness and headaches.  All other systems reviewed and are negative.    Physical Exam Updated Vital Signs BP 163/85 (BP Location: Right Arm)   Pulse 87   Temp 98 F (36.7 C) (Oral)   Resp 18   SpO2 96%   Physical Exam  Constitutional: He is oriented to person, place, and time. He appears well-developed and well-nourished. No distress.  HENT:  Head: Normocephalic and atraumatic.  Eyes: Conjunctivae are normal.  Neck: Neck supple.  Cardiovascular: Normal rate, regular rhythm and normal heart sounds.  Exam reveals no friction rub.   No murmur heard. Pulmonary/Chest: Effort normal and breath sounds normal. No respiratory distress. He has no wheezes. He has no rales.  Abdominal: Soft. Bowel sounds are normal. He exhibits no distension. There is tenderness in the right upper quadrant, epigastric area  and periumbilical area. There is guarding. There is no rebound.  Musculoskeletal: He exhibits no edema.  Neurological: He is alert and oriented to person, place, and time. He exhibits normal muscle tone.  Skin: Skin is warm. Capillary refill takes less than 2 seconds.  Psychiatric: He has a normal mood and affect.  Nursing note and vitals reviewed.    ED Treatments / Results   Labs (all labs ordered are listed, but only abnormal results are displayed) Labs Reviewed  CBC WITH DIFFERENTIAL/PLATELET - Abnormal; Notable for the following:       Result Value   WBC 20.2 (*)    Neutro Abs 15.5 (*)    Monocytes Absolute 1.4 (*)    All other components within normal limits  COMPREHENSIVE METABOLIC PANEL - Abnormal; Notable for the following:    Glucose, Bld 149 (*)    Calcium 8.7 (*)    All other components within normal limits  LIPASE, BLOOD  TROPONIN I  URINALYSIS, ROUTINE W REFLEX MICROSCOPIC (NOT AT [facility])  I-STAT CG4 LACTIC ACID, ED    EKG  EKG Interpretation  Date/Time:  Saturday September 21 2016 09:32:10 EST Ventricular Rate:  88 PR Interval:    QRS Duration: 96 QT Interval:  384 QTC Calculation: 465 R Axis:   1 Text Interpretation:  Sinus rhythm No significant change since last tracing Confirmed by Shuntavia Yerby MD, Lysbeth Galas [phone]) on 09/21/2016 10:21:00 AM       Radiology Ct Abdomen Pelvis W Contrast  Result Date: 09/21/2016 CLINICAL DATA:  Periumbilical pain EXAM: CT ABDOMEN AND PELVIS WITH CONTRAST TECHNIQUE: Multidetector CT imaging of the abdomen and pelvis was performed using the standard protocol following bolus administration of intravenous contrast. CONTRAST:  124mL ISOVUE-300 IOPAMIDOL (ISOVUE-300) INJECTION 61% COMPARISON:  None. FINDINGS: Lower chest: Mild dependent atelectatic changes are noted. Hepatobiliary: Diffuse fatty infiltration of the liver is noted. The gallbladder is within normal limits. Pancreas: Unremarkable. No pancreatic ductal dilatation or surrounding inflammatory changes. Spleen: Normal in size without focal abnormality. Adrenals/Urinary Tract: Renal glands are within normal limits. Tiny right renal cyst is noted. No obstructive changes are seen. The bladder is well distended. Stomach/Bowel: Small sliding-type hiatal hernia is noted. No obstructive changes are noted within the bowel. The appendix is well visualized and  demonstrates a few small appendicoliths. Mild dilatation of the appendix to 11 mm is noted. Minimal inflammatory changes are noted distally. These changes could represent some very early appendicitis. Vascular/Lymphatic: Mild dilatation of the abdominal aorta is noted to 3.3 cm. No obstructive changes are seen. Scattered small lymph nodes are identified in the iliac chain as well as in the periaortic and pericaval region. Reproductive: Prostate is unremarkable. Other: No abdominal wall hernia or abnormality. No abdominopelvic ascites. Musculoskeletal: No acute or significant osseous findings. IMPRESSION: Mild dilatation of the appendix with very minimal inflammatory changes. Given the patient's elevated white blood cell count this may represent very early appendicitis. Dilatation of the infrarenal aorta as described. Scattered lymph nodes are noted in the retroperitoneum as well as in the iliac chains bilaterally. The majority of these measure 1 cm or less. Electronically Signed   By: Inez Catalina M.D.   On: 09/21/2016 10:52    Procedures Procedures (including critical care time)  Medications Ordered in ED Medications  iopamidol (ISOVUE-300) 61 % injection (not administered)  sodium chloride 0.9 % injection (not administered)  Chlorhexidine Gluconate Cloth 2 % PADS 6 each (not administered)    And  Chlorhexidine Gluconate Cloth 2 % PADS  6 each (not administered)  gabapentin (NEURONTIN) capsule 300 mg (not administered)  acetaminophen (TYLENOL) tablet 1,000 mg (not administered)  celecoxib (CELEBREX) 400 MG capsule 400 mg (not administered)  cefoTEtan (CEFOTAN) 2 g in dextrose 5 % 50 mL IVPB (not administered)  HYDROmorphone (DILAUDID) injection 1 mg (1 mg Intravenous Given 09/21/16 0937)  ondansetron (ZOFRAN) injection 4 mg (4 mg Intravenous Given 09/21/16 0937)  sodium chloride 0.9 % bolus 1,000 mL (0 mLs Intravenous Stopped 09/21/16 1113)  iopamidol (ISOVUE-300) 61 % injection 100 mL (100 mLs  Intravenous Contrast Given 09/21/16 1027)  0.9 %  sodium chloride infusion ( Intravenous New Bag/Given 09/21/16 1114)     Initial Impression / Assessment and Plan / ED Course  I have reviewed the triage vital signs and the nursing notes.  Pertinent labs & imaging results that were available during my care of the patient were reviewed by me and considered in my medical decision making (see chart for details).  Clinical Course     60 year old male with history of hypertension here with acute onset of epigastric and periumbilical abdominal pain. On arrival, vital signs are stable. Examination shows significant periumbilical and epigastric tenderness with guarding. Initial differential includes pancreatitis, gastritis, cholecystitis, most also consider appendicitis given periumbilical pain and anorexia. Patient has no history of previous GI abnormalities. Will obtain screening labs, start with CT given undifferentiated nature, and pain control.  Labs showed leukocytosis of 20,000 with left shift. Lactic acid is normal. CT scan shows early appendicitis with surrounding stranding. Dr. Johney Maine of [facility] surgery paged. Patient nothing by mouth.  Discussed with Dr. Johney Maine, appreciate input. Patient to be given ABX, NPO for OR. Pt updated and in agreement.  Final Clinical Impressions(s) / ED Diagnoses   Final diagnoses:  Acute appendicitis with localized peritonitis  Other elevated white blood cell (WBC) count    New Prescriptions New Prescriptions   No medications on file     Duffy Bruce, MD 09/21/16 1127

## 2016-09-21 NOTE — Op Note (Signed)
6:48 PM  09/21/2016   PATIENT:  Devin Goodwin  60 y.o. male  Patient Care Team: Joanette Gula, MD as PCP - General (Internal Medicine) Janie Morning, MD as Consulting Physician (Gastroenterology) Cathe Mons, MD as Consulting Physician (Colon and Rectal Surgery) Amilio Boston, MD as Consulting Physician (General Surgery)  PRE-OPERATIVE DIAGNOSIS:  Appendicitis, History of rectal carcinoid  POST-OPERATIVE DIAGNOSIS:    Early perforated appendicitis History of rectal carcinoid   PROCEDURE:    APPENDECTOMY LAPAROSCOPIC EXAMINATION UNDER ANESTHESIA  SURGEON:  Surgeon(s): Ameir Boston, MD  ANESTHESIA:   local and general  EBL:  Total I/O In: 3000 [I.V.:1000; IV Piggyback:2000] Out: 1360 [Urine:1350; Blood:10]  Delay start of Pharmacological VTE agent (>24hrs) due to surgical blood loss or risk of bleeding:  no  DRAINS: none   SPECIMEN:  Source of Specimen:  APPENDIX  DISPOSITION OF SPECIMEN:  PATHOLOGY  COUNTS:  YES  PLAN OF CARE: Admit for overnight observation  PATIENT DISPOSITION:  PACU - hemodynamically stable.   INDICATIONS: Patient with concerning symptoms & work up suspicious for appendicitis.  Surgery was recommended:  The anatomy & physiology of the digestive tract was discussed.  The pathophysiology of appendicitis was discussed.  Natural history risks without surgery was discussed.   I feel the risks of no intervention will lead to serious problems that outweigh the operative risks; therefore, I recommended diagnostic laparoscopy with removal of appendix to remove the pathology.  Laparoscopic & open techniques were discussed.   I noted a good likelihood this will help address the problem.    Risks such as bleeding, infection, abscess, leak, reoperation, possible ostomy, hernia, heart attack, death, and other risks were discussed.  Goals of post-operative recovery were discussed as well.  We will work to minimize complications.  Questions  were answered.  The patient expresses understanding & wishes to proceed with surgery.  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  Never followed up.  I recommended examination under anesthesia to make sure no recurrence.  OR FINDINGS: No perianal abnormalities.  No rectal masses to 7 cm by digital palpation.  No stricture.  Grade 2 internal hemorrhoids  Long retrocmesenteric appendix with inflammation and irritation and probable early perforation.  DESCRIPTION:   The patient was identified & brought into the operating room. The patient was positioned supine with arms tucked. SCDs were active during the entire case. The patient underwent general anesthesia without any difficulty.  A Surgical Timeout confirmed our plan.  Proceed with examination under anesthesia..  All skin was clear.  No pruritus.  No external lesions.  Normal sphincter tone.  Smooth rectal wall up to 7 cm.  Grade 1 and two internal hemorrhoids without any prolapse or bleeding.  No condyloma.  No stricture.  No fissure.  Felt no abnormal scarring or other abnormalities.  Consistent with a normal examination under anesthesia.  The abdomen was prepped and draped in a sterile fashion.   I made a transverse incision through the superior umbilical fold.  I made a small transverse nick through the infraumbilical fascia and confirmed peritoneal entry.  I placed a 50mm port.  We induced carbon dioxide insufflation.  Camera inspection revealed no injury.  I placed additional ports under direct laparoscopic visualization.  Patient had a very redundant sigmoid colon.   I had to mobilize it  it off the right pelvic wall and right lower quadrant.  Found the cecum rather high riding in the  upper paracolic gutter.  I had to mobilize a knuckle of mid ileum off the inflamed terminal ileal mesentery.  After mobilizing off I confirmed that the tip of an inflamed appendix.  I mobilized the terminal ileum to  proximal ascending colon in a lateral to medial fashion.  I took care to avoid injuring any retroperitoneal structures.   Patient had an inflamed tubular structure densely adherent to the terminal ileal mesentery.  Eventually was able to dissect around and confirmed the tip of an inflamed and probably early perforated appendix.  I freed the appendix off its attachments to the terminal ileum and cecal mesentery.  It was quite long and this took some time but eventually I was able to dissect it off and transected the mesoappendix.  I was able to free off the base of the appendix which was still viable and not nearly as inflamed.  I stapled the appendix off the cecum using a laparoscopic stapler. I took a healthy cuff of viable cecum. I ligated the mesoappendix and assured hemostasis in the mesentery.     I removed the appendix inside the 12 mm port.  I did copious irrigation. Hemostasis was good in the mesoappendix, colon mesentery, and retroperitoneum. Staple line was intact on the cecum with no bleeding. I washed out the pelvis, retrohepatic space and right paracolic gutter. I washed out the left side as well.  Hemostasis is good. There was no perforation or injury.  Because the area cleaned up well after irrigation, I did not place a drain.  I aspirated the carbon dioxide. I removed the ports. I closed the 12 mm fascia site using a 0 Vicryl stitch using a laparoscopic suture passer under direct sedation given his thick abdominal wall.. I closed skin using 4-0 monocryl stitch.  Patient was extubated and sent to the recovery room.  I discussed the operative findings with the patient's  family. I suspect the patient is going used in the hospital at least overnight and will need antibiotics for 3 days. Questions answered. They expressed understanding and appreciation.  [name], M.D., F.A.C.S. Gastrointestinal and Minimally Invasive Surgery [facility] [address], [place], [place] [address] [phone] Main / Paging

## 2016-09-22 DIAGNOSIS — K4090 Unilateral inguinal hernia, without obstruction or gangrene, not specified as recurrent: Secondary | ICD-10-CM

## 2016-09-22 DIAGNOSIS — R12 Heartburn: Secondary | ICD-10-CM

## 2016-09-22 MED ORDER — PANTOPRAZOLE SODIUM 40 MG PO TBEC
40.0000 mg | DELAYED_RELEASE_TABLET | Freq: Two times a day (BID) | ORAL | Status: DC
  Administered 2016-09-22 – 2016-09-25 (×6): 40 mg via ORAL
  Filled 2016-09-22 (×7): qty 1

## 2016-09-22 MED ORDER — KETOROLAC TROMETHAMINE 15 MG/ML IJ SOLN
15.0000 mg | Freq: Four times a day (QID) | INTRAMUSCULAR | Status: AC
  Administered 2016-09-22 – 2016-09-24 (×8): 15 mg via INTRAVENOUS
  Filled 2016-09-22 (×8): qty 1

## 2016-09-22 MED ORDER — PIPERACILLIN-TAZOBACTAM 3.375 G IVPB
3.3750 g | Freq: Three times a day (TID) | INTRAVENOUS | Status: AC
  Administered 2016-09-22 – 2016-09-25 (×9): 3.375 g via INTRAVENOUS
  Filled 2016-09-22 (×10): qty 50

## 2016-09-22 MED ORDER — ACETAMINOPHEN 650 MG RE SUPP: 650.0000 mg | Freq: Four times a day (QID) | RECTAL | Status: DC | PRN

## 2016-09-22 MED ORDER — ACETAMINOPHEN 325 MG PO TABS
325.0000 mg | ORAL_TABLET | Freq: Four times a day (QID) | ORAL | Status: DC | PRN
  Administered 2016-09-24: 650 mg via ORAL
  Filled 2016-09-22: qty 2

## 2016-09-22 MED ORDER — METHOCARBAMOL 1000 MG/10ML IJ SOLN
1000.0000 mg | Freq: Three times a day (TID) | INTRAVENOUS | Status: DC
  Administered 2016-09-22 – 2016-09-23 (×4): 1000 mg via INTRAVENOUS
  Filled 2016-09-22 (×3): qty 10

## 2016-09-22 MED ORDER — AMLODIPINE BESYLATE 5 MG PO TABS
5.0000 mg | ORAL_TABLET | Freq: Once | ORAL | Status: AC
  Administered 2016-09-22: 5 mg via ORAL
  Filled 2016-09-22: qty 1

## 2016-09-22 NOTE — Progress Notes (Signed)
Pharmacy Antibiotic Note  Devin Goodwin is a 60 y.o. male admitted on 09/21/2016 with ccute appendicitis s/p lap appendectomy .  Pharmacy has been consulted for Zosyn dosing. Continue antibiotics for 3 days post-op per surgeon's note.  CrCl >37ml/min.   Plan: Zosyn 3.375g IV q8h (4 hour infusion).x 72hrs (stop date entered) No dose adjustments anticipated.  Pharmacy to sign off.  Please re-consult if needed.   Height: 5\' 10"  (177.8 cm) Weight: 200 lb (90.7 kg) IBW/kg (Calculated) : 73  Temp (24hrs), Avg:98.9 F (37.2 C), Min:97.6 F (36.4 C), Max:101.2 F (38.4 C)   Recent Labs Lab 09/21/16 0925 09/21/16 0947  WBC 20.2*  --   CREATININE 1.02  --   LATICACIDVEN  --  1.29    Estimated Creatinine Clearance: 86.2 mL/min (by C-G formula based on SCr of 1.02 mg/dL).    Allergies  Allergen Reactions  . Lisinopril Cough  . Simvastatin Other (See Comments)    myalgia    Antimicrobials this admission: 11/26  Zosyn >>   Dose adjustments this admission:  Microbiology results: None  Thank you for allowing pharmacy to be a part of this patient's care.  Netta Cedars, PharmD, BCPS Pager: [phone] 09/22/2016 12:06 PM

## 2016-09-22 NOTE — Progress Notes (Signed)
[address] [place], [place] [address] Phone: [phone] FAX: [phone]   MARSHAWN NORMOYLE [id number] 08-21-1956    Problem List:   Principal Problem:   Acute appendicitis s/p lap appendectomy 09/21/2016 Active Problems:   AAA (abdominal aortic aneurysm)    Essential hypertension   Carcinoid tumor of rectum s/p removal 2012   Hyperlipidemia   Tobacco abuse   Constipation, chronic   Inguinal hernia, right   Heartburn   1 Day Post-Op  09/21/2016  Procedure(s): APPENDECTOMY LAPAROSCOPIC   Assessment  Struggling with possible ileus but not toxic  Plan:  Keep at liquids for now.  Improve pain control.  Scheduled ketorolac and methocarbamol.  IV fluids.  Nausea control.  Heartburn/reflux control.  Stop drinking Coca-Cola.  Add PPI.  Tachycardia.  Most likely due to pain.  Give extra dose of amlodipine and follow.  Beta blockers and other medications as backup   Antibiotics for three days total.  Use Zosyn since lack of metronidazole.  No evidence of recurrence in the perirectal/anal region.  Discussed with patient.  Incidental right lower hernia noted.  He has had a couple episodes of sharp groin and testicular pain.  Do not know fits related that.  He most likely would benefit from elective surgical repair of this in the future.  We can discuss with him as an outpatient.  -VTE prophylaxis- SCDs, etc  -mobilize as tolerated to help recovery  [name], M.D., F.A.C.S. Gastrointestinal and Minimally Invasive Surgery [facility] [address], [place], [place] [address] [phone] Main / Paging   09/22/2016  CARE TEAM:  PCP: Joanette Gula, MD  Outpatient Care Team: Patient Care Team: Joanette Gula, MD as PCP - General (Internal Medicine) Janie Morning, MD as Consulting Physician (Gastroenterology) Cathe Mons, MD as Consulting  Physician (Colon and Rectal Surgery) Sidhant Boston, MD as Consulting Physician (General Surgery)  Inpatient Treatment Team: Treatment Team: Attending Provider: Nolon Nations, MD; Consulting Physician: Nolon Nations, MD  Subjective:  Sore.  Nauseated.  Significant heartburn / reflux but likes to drink his Coca-Cola.  A little bit of flatus.  Walked in hallways a lot last night.  Family at bedside.  Objective:  Vital signs:  Vitals:   09/21/16 2252 09/22/16 0200 09/22/16 0555 09/22/16 1043  BP:  136/60 136/66 (!) 152/70  Pulse: 97 (!) 114 (!) 112   Resp: 16 16    Temp: 99 F (37.2 C) (!) 101.2 F (38.4 C) 99.6 F (37.6 C)   TempSrc: Oral Oral Oral   SpO2: 98% 93% 94%   Weight:      Height:        Last BM Date: 09/21/16  Intake/Output   Yesterday:  11/25 0701 - 11/26 0700 In: 6010 [I.V.:1900; IV Piggyback:2050] Out: 9323 [Urine:1700; Blood:10] This shift:  Total I/O In: 120 [P.O.:120] Out: -   Bowel function:  Flatus: YES  BM:  No  Drain: (No drain)   Physical Exam:  General: Pt awake/alert/oriented x4 in mild acute distress Eyes: PERRL, normal EOM.  Sclera clear.  No icterus Neuro: CN II-XII intact w/o focal sensory/motor deficits. Lymph: No head/neck/groin lymphadenopathy Psych:  No delerium/psychosis/paranoia HENT: Normocephalic, Mucus membranes moist.  No thrush Neck: Supple, No tracheal deviation Chest: No chest wall pain w good excursion CV:  Pulses intact.  Regular rhythm MS: Normal AROM mjr joints.  No obvious deformity Abdomen: Somewhat firm.  Moderately distended.  Mildly  tender at incisions only.  No evidence of peritonitis.  No incarcerated hernias. Ext:  SCDs BLE.  No mjr edema.  No cyanosis Skin: No petechiae / purpura  Results:   Labs: Results for orders placed or performed during the hospital encounter of 09/21/16 (from the past 48 hour(s))  CBC with Differential     Status: Abnormal   Collection Time: 09/21/16  9:25 AM  Result Value Ref  Range   WBC 20.2 (H) 4.0 - 10.5 K/uL   RBC 4.39 4.22 - 5.81 MIL/uL   Hemoglobin 13.6 13.0 - 17.0 g/dL   HCT 39.5 39.0 - 52.0 %   MCV 90.0 78.0 - 100.0 fL   MCH 31.0 26.0 - 34.0 pg   MCHC 34.4 30.0 - 36.0 g/dL   RDW 12.9 11.5 - 15.5 %   Platelets 337 150 - 400 K/uL   Neutrophils Relative % 77 %   Neutro Abs 15.5 (H) 1.7 - 7.7 K/uL   Lymphocytes Relative 16 %   Lymphs Abs 3.2 0.7 - 4.0 K/uL   Monocytes Relative 7 %   Monocytes Absolute 1.4 (H) 0.1 - 1.0 K/uL   Eosinophils Relative 1 %   Eosinophils Absolute 0.2 0.0 - 0.7 K/uL   Basophils Relative 1 %   Basophils Absolute 0.1 0.0 - 0.1 K/uL  Comprehensive metabolic panel     Status: Abnormal   Collection Time: 09/21/16  9:25 AM  Result Value Ref Range   Sodium 137 135 - 145 mmol/L   Potassium 4.0 3.5 - 5.1 mmol/L   Chloride 104 101 - 111 mmol/L   CO2 26 22 - 32 mmol/L   Glucose, Bld 149 (H) 65 - 99 mg/dL   BUN 13 6 - 20 mg/dL   Creatinine, Ser 1.02 0.61 - 1.24 mg/dL   Calcium 8.7 (L) 8.9 - 10.3 mg/dL   Total Protein 7.1 6.5 - 8.1 g/dL   Albumin 4.2 3.5 - 5.0 g/dL   AST 27 15 - 41 U/L   ALT 20 17 - 63 U/L   Alkaline Phosphatase 95 38 - 126 U/L   Total Bilirubin 0.8 0.3 - 1.2 mg/dL   GFR calc non Af Amer >60 >60 mL/min   GFR calc Af Amer >60 >60 mL/min    Comment: (NOTE) The eGFR has been calculated using the CKD EPI equation. This calculation has not been validated in all clinical situations. eGFR's persistently <60 mL/min signify possible Chronic Kidney Disease.    Anion gap 7 5 - 15  Lipase, blood     Status: None   Collection Time: 09/21/16  9:25 AM  Result Value Ref Range   Lipase 30 11 - 51 U/L  Troponin I     Status: None   Collection Time: 09/21/16  9:25 AM  Result Value Ref Range   Troponin I <0.03 <0.03 ng/mL  I-Stat CG4 Lactic Acid, ED     Status: None   Collection Time: 09/21/16  9:47 AM  Result Value Ref Range   Lactic Acid, Venous 1.29 0.5 - 1.9 mmol/L    Imaging / Studies: Ct Abdomen Pelvis W  Contrast  Result Date: 09/21/2016 CLINICAL DATA:  Periumbilical pain EXAM: CT ABDOMEN AND PELVIS WITH CONTRAST TECHNIQUE: Multidetector CT imaging of the abdomen and pelvis was performed using the standard protocol following bolus administration of intravenous contrast. CONTRAST:  129m ISOVUE-300 IOPAMIDOL (ISOVUE-300) INJECTION 61% COMPARISON:  None. FINDINGS: Lower chest: Mild dependent atelectatic changes are noted. Hepatobiliary: Diffuse fatty infiltration of the liver  is noted. The gallbladder is within normal limits. Pancreas: Unremarkable. No pancreatic ductal dilatation or surrounding inflammatory changes. Spleen: Normal in size without focal abnormality. Adrenals/Urinary Tract: Renal glands are within normal limits. Tiny right renal cyst is noted. No obstructive changes are seen. The bladder is well distended. Stomach/Bowel: Small sliding-type hiatal hernia is noted. No obstructive changes are noted within the bowel. The appendix is well visualized and demonstrates a few small appendicoliths. Mild dilatation of the appendix to 11 mm is noted. Minimal inflammatory changes are noted distally. These changes could represent some very early appendicitis. Vascular/Lymphatic: Mild dilatation of the abdominal aorta is noted to 3.3 cm. No obstructive changes are seen. Scattered small lymph nodes are identified in the iliac chain as well as in the periaortic and pericaval region. Reproductive: Prostate is unremarkable. Other: No abdominal wall hernia or abnormality. No abdominopelvic ascites. Musculoskeletal: No acute or significant osseous findings. IMPRESSION: Mild dilatation of the appendix with very minimal inflammatory changes. Given the patient's elevated white blood cell count this may represent very early appendicitis. Dilatation of the infrarenal aorta as described. Scattered lymph nodes are noted in the retroperitoneum as well as in the iliac chains bilaterally. The majority of these measure 1 cm or  less. Electronically Signed   By: Inez Catalina M.D.   On: 09/21/2016 10:52    Medications / Allergies: per chart  Antibiotics: Anti-infectives    Start     Dose/Rate Route Frequency Ordered Stop   09/21/16 1400  cefoTEtan (CEFOTAN) 2 g in dextrose 5 % 50 mL IVPB     2 g 100 mL/hr over 30 Minutes Intravenous On call to O.R. 09/21/16 1246 09/21/16 1740   09/21/16 1300  cefTRIAXone (ROCEPHIN) 2 g in dextrose 5 % 50 mL IVPB  Status:  Discontinued    Comments:  Pharmacy may adjust dosing strength / duration / interval for maximal efficacy   2 g 100 mL/hr over 30 Minutes Intravenous Every 24 hours 09/21/16 1136 09/22/16 1056   09/21/16 1130  cefoTEtan (CEFOTAN) 2 g in dextrose 5 % 50 mL IVPB  Status:  Discontinued     2 g 100 mL/hr over 30 Minutes Intravenous On call to O.R. 09/21/16 1125 09/21/16 1247        Note: Portions of this report may have been transcribed using voice recognition software. Every effort was made to ensure accuracy; however, inadvertent computerized transcription errors may be present.   Any transcriptional errors that result from this process are unintentional.     [name], M.D., F.A.C.S. Gastrointestinal and Minimally Invasive Surgery [facility] [address] [place], [place] [address] [phone] Main / Paging   09/22/2016

## 2016-09-23 ENCOUNTER — Encounter (HOSPITAL_COMMUNITY): Payer: Self-pay | Admitting: Surgery

## 2016-09-23 LAB — BASIC METABOLIC PANEL
ANION GAP: 7 (ref 5–15)
BUN: 26 mg/dL — AB (ref 6–20)
CHLORIDE: 101 mmol/L (ref 101–111)
CO2: 28 mmol/L (ref 22–32)
Calcium: 8.9 mg/dL (ref 8.9–10.3)
Creatinine, Ser: 1.35 mg/dL — ABNORMAL HIGH (ref 0.61–1.24)
GFR calc Af Amer: >60 mL/min (ref >60)
GFR, EST NON AFRICAN AMERICAN: 55 mL/min — AB (ref >60)
GLUCOSE: 112 mg/dL — AB (ref 65–99)
POTASSIUM: 3.5 mmol/L (ref 3.5–5.1)
Sodium: 136 mmol/L (ref 135–145)

## 2016-09-23 LAB — CBC
HEMATOCRIT: 32.8 % — AB (ref 39.0–52.0)
HEMOGLOBIN: 11 g/dL — AB (ref 13.0–17.0)
MCH: 30.8 pg (ref 26.0–34.0)
MCHC: 33.5 g/dL (ref 30.0–36.0)
MCV: 91.9 fL (ref 78.0–100.0)
Platelets: 312 10*3/uL (ref 150–400)
RBC: 3.57 MIL/uL — ABNORMAL LOW (ref 4.22–5.81)
RDW: 13.5 % (ref 11.5–15.5)
WBC: 24.4 10*3/uL — ABNORMAL HIGH (ref 4.0–10.5)

## 2016-09-23 MED ORDER — METHOCARBAMOL 500 MG PO TABS
1000.0000 mg | ORAL_TABLET | Freq: Three times a day (TID) | ORAL | Status: AC
  Administered 2016-09-23 – 2016-09-24 (×2): 1000 mg via ORAL
  Filled 2016-09-23 (×2): qty 2

## 2016-09-23 NOTE — Progress Notes (Signed)
Patient ID: Devin Goodwin, male   DOB: 1956/06/19, 60 y.o.   MRN: [id number]   LOS: 2 days   Subjective: Feels about the same as yesterday. Tolerating fulls but no flatus yet. Pain mostly controlled.   Objective: Vital signs in last 24 hours: Temp:  [99 F (37.2 C)-99.2 F (37.3 C)] 99.2 F (37.3 C) (11/27 0502) Pulse Rate:  [105-109] 105 (11/27 0502) Resp:  [16-18] 18 (11/27 0502) BP: (129-134)/(65) 134/65 (11/27 0502) SpO2:  [91 %-94 %] 91 % (11/27 0502) Last BM Date: 09/21/16   Laboratory  CBC  Recent Labs  09/21/16 0925 09/23/16 0439  WBC 20.2* 24.4*  HGB 13.6 11.0*  HCT 39.5 32.8*  PLT 337 312   BMET  Recent Labs  09/21/16 0925 09/23/16 0439  NA 137 136  K 4.0 3.5  CL 104 101  CO2 26 28  GLUCOSE 149* 112*  BUN 13 26*  CREATININE 1.02 1.35*  CALCIUM 8.7* 8.9    Physical Exam General appearance: alert and no distress Resp: clear to auscultation bilaterally Cardio: ejection click present and Mild tachycardia GI: Soft, appropriate TTP, hyperactive BS   Assessment/Plan: Acute appendicitis s/p lap appendectomy 11/25 Dr. Johney Maine -- No fevers but WBC rising still. On IV Zosyn for 3d, D2/3. WBC elevated, no fever, excellent BS reassuring. Right inguinal hernia -- F/u as OP with Dr. Johney Maine Multiple medical problems -- Home meds FEN -- BUN/Cr up somewhat, increase IVF VTE -- SCD's, Lovenox Dispo -- Ileus    Lisette Abu, PA-C Pager: [phone] 09/23/2016

## 2016-09-24 LAB — CBC
HCT: 27.9 % — ABNORMAL LOW (ref 39.0–52.0)
Hemoglobin: 9.5 g/dL — ABNORMAL LOW (ref 13.0–17.0)
MCH: 31.4 pg (ref 26.0–34.0)
MCHC: 34.1 g/dL (ref 30.0–36.0)
MCV: 92.1 fL (ref 78.0–100.0)
Platelets: 282 10*3/uL (ref 150–400)
RBC: 3.03 MIL/uL — ABNORMAL LOW (ref 4.22–5.81)
RDW: 13.5 % (ref 11.5–15.5)
WBC: 19.9 10*3/uL — ABNORMAL HIGH (ref 4.0–10.5)

## 2016-09-24 LAB — BASIC METABOLIC PANEL
Anion gap: 6 (ref 5–15)
BUN: 16 mg/dL (ref 6–20)
CALCIUM: 8.3 mg/dL — AB (ref 8.9–10.3)
CO2: 26 mmol/L (ref 22–32)
CREATININE: 1.17 mg/dL (ref 0.61–1.24)
Chloride: 104 mmol/L (ref 101–111)
GFR calc non Af Amer: >60 mL/min (ref >60)
Glucose, Bld: 97 mg/dL (ref 65–99)
Potassium: 4.1 mmol/L (ref 3.5–5.1)
SODIUM: 136 mmol/L (ref 135–145)

## 2016-09-24 MED ORDER — SENNOSIDES-DOCUSATE SODIUM 8.6-50 MG PO TABS
2.0000 | ORAL_TABLET | Freq: Once | ORAL | Status: AC
  Administered 2016-09-24: 2 via ORAL
  Filled 2016-09-24: qty 2

## 2016-09-24 NOTE — Progress Notes (Signed)
3 Days Post-Op  Subjective: He is doing better, less discomfort, walking tolerating full liquids, some flatus and no BM so far.  Objective: Vital signs in last 24 hours: Temp:  [98.9 F (37.2 C)-99.9 F (37.7 C)] 98.9 F (37.2 C) (11/28 0640) Pulse Rate:  [88-110] 103 (11/28 0640) Resp:  [16] 16 (11/28 0640) BP: (130-149)/(76-77) 130/77 (11/28 0640) SpO2:  [93 %-98 %] 98 % (11/28 0640) Last BM Date: 09/21/16 2340 PO 1700IV Urine 2140 TM 99.9, VSS BMP OK, creatinine is stable wBC trending down    Intake/Output from previous day: 11/27 0701 - 11/28 0700 In: 4451.7 [P.O.:2340; I.V.:1741.7; IV Piggyback:370] Out: 2140 [Urine:2140] Intake/Output this shift: No intake/output data recorded.  General appearance: alert, cooperative and no distress Resp: clear to auscultation bilaterally and few rales in bases GI: soft, sore, sites OK, + BS and flatus  Lab Results:   Recent Labs  09/23/16 0439 09/24/16 0441  WBC 24.4* 19.9*  HGB 11.0* 9.5*  HCT 32.8* 27.9*  PLT 312 282    BMET  Recent Labs  09/23/16 0439 09/24/16 0441  NA 136 136  K 3.5 4.1  CL 101 104  CO2 28 26  GLUCOSE 112* 97  BUN 26* 16  CREATININE 1.35* 1.17  CALCIUM 8.9 8.3*   PT/INR No results for input(s): LABPROT, INR in the last 72 hours.   Recent Labs Lab 09/21/16 0925  AST 27  ALT 20  ALKPHOS 95  BILITOT 0.8  PROT 7.1  ALBUMIN 4.2     Lipase     Component Value Date/Time   LIPASE 30 09/21/2016 0925     Studies/Results: No results found. Prior to Admission medications   Medication Sig Start Date End Date Taking? Authorizing Provider  amLODipine (NORVASC) 5 MG tablet Take 5 mg by mouth daily.    Yes Historical Provider, MD  losartan (COZAAR) 100 MG tablet Take 100 mg by mouth daily.   Yes Historical Provider, MD  naproxen (NAPROSYN) 500 MG tablet Take 1 tablet (500 mg total) by mouth every 12 (twelve) hours as needed for mild pain or moderate pain. 09/21/16   Andon Boston, MD   oxyCODONE (OXY IR/ROXICODONE) 5 MG immediate release tablet Take 1-2 tablets (5-10 mg total) by mouth every 4 (four) hours as needed for moderate pain, severe pain or breakthrough pain. 09/21/16   Johnross Boston, MD    Medications: . amLODipine  5 mg Oral Daily  . enoxaparin (LOVENOX) injection  40 mg Subcutaneous Q24H  . lip balm  1 application Topical BID  . losartan  50 mg Oral Daily  . nicotine  21 mg Transdermal Daily  . pantoprazole  40 mg Oral BID AC  . piperacillin-tazobactam (ZOSYN)  IV  3.375 g Intravenous Q8H  . sodium chloride flush  3 mL Intravenous Q12H   . lactated ringers 125 mL/hr at 09/24/16 0845   AAA Hypertension Tobacco use Chronic constipation  Assessment/Plan Acute appendicitis s/p lap appendectomy 11/25 Dr. Johney Maine -- No fevers but WBC rising still. On IV Zosyn for 3d, D2/3. WBC elevated, no fever, excellent BS reassuring. Right inguinal hernia -- F/u as OP with Dr. Johney Maine Multiple medical problems -- Home meds ID: Day 4 antibiotics;  day 3 Zosyn FEN -- BUN/Cr up somewhat, increase IVF/full liquids =>> soft diet as tolerated VTE -- SCD's, Lovenox Dispo -- Ileus    Plan:  Advance his diet and continue antibiotics.  Decrease IV fluids.  Push IS.  LOS: 3 days    Armenta Erskin  09/24/2016 [phone]  

## 2016-09-25 ENCOUNTER — Inpatient Hospital Stay (HOSPITAL_COMMUNITY): Payer: BLUE CROSS/BLUE SHIELD

## 2016-09-25 LAB — CBC
HCT: 29.7 % — ABNORMAL LOW (ref 39.0–52.0)
HEMOGLOBIN: 10.1 g/dL — AB (ref 13.0–17.0)
MCH: 31.1 pg (ref 26.0–34.0)
MCHC: 34 g/dL (ref 30.0–36.0)
MCV: 91.4 fL (ref 78.0–100.0)
PLATELETS: 344 10*3/uL (ref 150–400)
RBC: 3.25 MIL/uL — AB (ref 4.22–5.81)
RDW: 13.5 % (ref 11.5–15.5)
WBC: 21.9 10*3/uL — ABNORMAL HIGH (ref 4.0–10.5)

## 2016-09-25 MED ORDER — IOPAMIDOL (ISOVUE-300) INJECTION 61%
INTRAVENOUS | Status: AC
  Filled 2016-09-25: qty 100

## 2016-09-25 MED ORDER — IOPAMIDOL (ISOVUE-300) INJECTION 61%: 15.0000 mL | Freq: Once | INTRAVENOUS | Status: DC | PRN

## 2016-09-25 MED ORDER — BOOST / RESOURCE BREEZE PO LIQD
1.0000 | Freq: Three times a day (TID) | ORAL | Status: DC
  Administered 2016-09-27 – 2016-10-08 (×22): 1 via ORAL

## 2016-09-25 MED ORDER — IOPAMIDOL (ISOVUE-300) INJECTION 61%
100.0000 mL | Freq: Once | INTRAVENOUS | Status: AC | PRN
  Administered 2016-09-25: 100 mL via INTRAVENOUS

## 2016-09-25 MED ORDER — SODIUM CHLORIDE 0.9 % IJ SOLN
INTRAMUSCULAR | Status: AC
  Filled 2016-09-25: qty 50

## 2016-09-25 NOTE — Progress Notes (Signed)
4 Days Post-Op  Subjective: HE feels terrible, he feels warm although temp is 97.8.  Abdomen distended, lots of flatus and some loose stool.   He is on a soft diet, but he cannot eat. Objective: Vital signs in last 24 hours: Temp:  [98.7 F (37.1 C)-100.4 F (38 C)] 98.7 F (37.1 C) (11/29 0528) Pulse Rate:  [97-111] 99 (11/29 0528) Resp:  [14-16] 16 (11/29 0528) BP: (138-161)/(63-81) 146/71 (11/29 0528) SpO2:  [93 %-96 %] 95 % (11/29 0528) Last BM Date: 09/25/16 1110 PO 1400 IV 2260 urine BM x 2 Low grade fever from 11 AM- 7 pm last night, mild tachycardia WBC is still up at 21.9K   Intake/Output from previous day: 11/28 0701 - 11/29 0700 In: 2608.8 [P.O.:1110; I.V.:1348.8; IV Piggyback:150] Out: 2260 [Urine:2260] Intake/Output this shift: No intake/output data recorded.  General appearance: alert, cooperative, no distress and feels terrible. Resp: clear to auscultation bilaterally GI: distended, a little hyperactive BS, + Flatus and BM Extremities: no edema.  Lab Results:   Recent Labs  09/24/16 0441 09/25/16 0456  WBC 19.9* 21.9*  HGB 9.5* 10.1*  HCT 27.9* 29.7*  PLT 282 344    BMET  Recent Labs  09/23/16 0439 09/24/16 0441  NA 136 136  K 3.5 4.1  CL 101 104  CO2 28 26  GLUCOSE 112* 97  BUN 26* 16  CREATININE 1.35* 1.17  CALCIUM 8.9 8.3*   PT/INR No results for input(s): LABPROT, INR in the last 72 hours.   Recent Labs Lab 09/21/16 0925  AST 27  ALT 20  ALKPHOS 95  BILITOT 0.8  PROT 7.1  ALBUMIN 4.2     Lipase     Component Value Date/Time   LIPASE 30 09/21/2016 0925     Studies/Results: No results found.  Medications: . amLODipine  5 mg Oral Daily  . enoxaparin (LOVENOX) injection  40 mg Subcutaneous Q24H  . lip balm  1 application Topical BID  . losartan  50 mg Oral Daily  . nicotine  21 mg Transdermal Daily  . pantoprazole  40 mg Oral BID AC  . sodium chloride flush  3 mL Intravenous Q12H    Assessment/Plan Acute  appendicitis s/p lap appendectomy 11/25 Dr. Johney Maine -- No fevers but WBC rising still. On IV Zosyn for 3d, D2/3. WBC elevated, no fever, excellent BS reassuring. Right inguinal hernia-- F/u as OP with Dr. Johney Maine Multiple medical problems-- Home meds ID: Day 5 antibiotics;  day 4  Zosyn FEN-- BUN/Cr up somewhat, increase IVF/full liquids =>> soft diet as tolerated VTE -- SCD's, Lovenox Dispo-- Ileus with ongoing distension, low grade fever and elevated WBC.  CT abdomen and pelvis ordered.  I am going back to clear liquids for now also. Recheck labs in AM.      LOS: 4 days    Zenola Dezarn 09/25/2016 [phone]

## 2016-09-26 ENCOUNTER — Encounter (HOSPITAL_COMMUNITY): Admission: EM | Disposition: A | Discharge status: Home / Self Care | Payer: Self-pay | Source: Home / Self Care

## 2016-09-26 ENCOUNTER — Inpatient Hospital Stay (HOSPITAL_COMMUNITY): Payer: BLUE CROSS/BLUE SHIELD | Admitting: Anesthesiology

## 2016-09-26 HISTORY — PX: LAPAROSCOPY: SHX197

## 2016-09-26 LAB — BASIC METABOLIC PANEL
Anion gap: 11 (ref 5–15)
BUN: 12 mg/dL (ref 6–20)
CO2: 25 mmol/L (ref 22–32)
CREATININE: 1.23 mg/dL (ref 0.61–1.24)
Calcium: 8.3 mg/dL — ABNORMAL LOW (ref 8.9–10.3)
Chloride: 98 mmol/L — ABNORMAL LOW (ref 101–111)
GFR calc Af Amer: >60 mL/min (ref >60)
GLUCOSE: 110 mg/dL — AB (ref 65–99)
Potassium: 3.5 mmol/L (ref 3.5–5.1)
SODIUM: 134 mmol/L — AB (ref 135–145)

## 2016-09-26 LAB — CBC
HCT: 35 % — ABNORMAL LOW (ref 39.0–52.0)
Hemoglobin: 11.7 g/dL — ABNORMAL LOW (ref 13.0–17.0)
MCH: 30.5 pg (ref 26.0–34.0)
MCHC: 33.4 g/dL (ref 30.0–36.0)
MCV: 91.4 fL (ref 78.0–100.0)
PLATELETS: 450 10*3/uL — AB (ref 150–400)
RBC: 3.83 MIL/uL — ABNORMAL LOW (ref 4.22–5.81)
RDW: 13.8 % (ref 11.5–15.5)
WBC: 30 10*3/uL — ABNORMAL HIGH (ref 4.0–10.5)

## 2016-09-26 LAB — HEPATIC FUNCTION PANEL
ALT: 18 U/L (ref 17–63)
AST: 21 U/L (ref 15–41)
Albumin: 2.8 g/dL — ABNORMAL LOW (ref 3.5–5.0)
Alkaline Phosphatase: 104 U/L (ref 38–126)
BILIRUBIN DIRECT: 0.3 mg/dL (ref 0.1–0.5)
BILIRUBIN INDIRECT: 0.9 mg/dL (ref 0.3–0.9)
Total Bilirubin: 1.2 mg/dL (ref 0.3–1.2)
Total Protein: 6.1 g/dL — ABNORMAL LOW (ref 6.5–8.1)

## 2016-09-26 LAB — SURGICAL PCR SCREEN
MRSA, PCR: INVALID — AB
STAPHYLOCOCCUS AUREUS: INVALID — AB

## 2016-09-26 LAB — PROTIME-INR
INR: 1.21
PROTHROMBIN TIME: 15.4 s — AB (ref 11.4–15.2)

## 2016-09-26 LAB — PREALBUMIN: Prealbumin: 5.2 mg/dL — ABNORMAL LOW (ref 18–38)

## 2016-09-26 LAB — APTT: aPTT: 29 seconds (ref 24–36)

## 2016-09-26 SURGERY — LAPAROSCOPY, DIAGNOSTIC
Anesthesia: General

## 2016-09-26 MED ORDER — LIDOCAINE 2% (20 MG/ML) 5 ML SYRINGE
INTRAMUSCULAR | Status: DC | PRN
  Administered 2016-09-26: 100 mg via INTRAVENOUS

## 2016-09-26 MED ORDER — MIDAZOLAM HCL 2 MG/2ML IJ SOLN
INTRAMUSCULAR | Status: AC
  Filled 2016-09-26: qty 2

## 2016-09-26 MED ORDER — CHLORHEXIDINE GLUCONATE CLOTH 2 % EX PADS
6.0000 | MEDICATED_PAD | Freq: Once | CUTANEOUS | Status: AC
  Administered 2016-09-26: 6 via TOPICAL

## 2016-09-26 MED ORDER — HYDROMORPHONE HCL 2 MG/ML IJ SOLN
0.5000 mg | INTRAMUSCULAR | Status: DC | PRN
  Administered 2016-09-26: 1 mg via INTRAVENOUS
  Administered 2016-09-27: 2 mg via INTRAVENOUS
  Administered 2016-09-27: 1 mg via INTRAVENOUS
  Administered 2016-09-27 (×2): 2 mg via INTRAVENOUS
  Filled 2016-09-26 (×7): qty 1

## 2016-09-26 MED ORDER — LACTATED RINGERS IV SOLN
INTRAVENOUS | Status: DC | PRN
  Administered 2016-09-26 (×2): via INTRAVENOUS

## 2016-09-26 MED ORDER — HYDROMORPHONE HCL 1 MG/ML IJ SOLN
INTRAMUSCULAR | Status: AC
  Administered 2016-09-26: 0.5 mg via INTRAVENOUS
  Filled 2016-09-26: qty 1

## 2016-09-26 MED ORDER — SODIUM CHLORIDE 0.9% FLUSH: 10.0000 mL | Freq: Two times a day (BID) | INTRAVENOUS | Status: DC

## 2016-09-26 MED ORDER — PROPOFOL 10 MG/ML IV BOLUS
INTRAVENOUS | Status: AC
  Filled 2016-09-26: qty 20

## 2016-09-26 MED ORDER — LACTATED RINGERS IR SOLN
Status: DC | PRN
  Administered 2016-09-26: 3000 mL

## 2016-09-26 MED ORDER — ROCURONIUM BROMIDE 10 MG/ML (PF) SYRINGE
PREFILLED_SYRINGE | INTRAVENOUS | Status: DC | PRN
  Administered 2016-09-26: 10 mg via INTRAVENOUS
  Administered 2016-09-26: 40 mg via INTRAVENOUS

## 2016-09-26 MED ORDER — MEPERIDINE HCL 50 MG/ML IJ SOLN: 6.2500 mg | INTRAMUSCULAR | Status: DC | PRN

## 2016-09-26 MED ORDER — ACETAMINOPHEN 10 MG/ML IV SOLN
INTRAVENOUS | Status: AC
  Filled 2016-09-26: qty 100

## 2016-09-26 MED ORDER — FAMOTIDINE IN NACL 20-0.9 MG/50ML-% IV SOLN
20.0000 mg | Freq: Two times a day (BID) | INTRAVENOUS | Status: DC
  Administered 2016-09-26 – 2016-10-06 (×20): 20 mg via INTRAVENOUS
  Filled 2016-09-26 (×21): qty 50

## 2016-09-26 MED ORDER — PROPOFOL 10 MG/ML IV BOLUS
INTRAVENOUS | Status: DC | PRN
  Administered 2016-09-26: 200 mg via INTRAVENOUS

## 2016-09-26 MED ORDER — DEXAMETHASONE SODIUM PHOSPHATE 10 MG/ML IJ SOLN
INTRAMUSCULAR | Status: DC | PRN
  Administered 2016-09-26: 10 mg via INTRAVENOUS

## 2016-09-26 MED ORDER — HYDROMORPHONE HCL 1 MG/ML IJ SOLN
INTRAMUSCULAR | Status: AC
  Administered 2016-09-26: 0.25 mg via INTRAVENOUS
  Filled 2016-09-26: qty 1

## 2016-09-26 MED ORDER — ONDANSETRON HCL 4 MG/2ML IJ SOLN
INTRAMUSCULAR | Status: DC | PRN
  Administered 2016-09-26: 4 mg via INTRAVENOUS

## 2016-09-26 MED ORDER — BUPIVACAINE-EPINEPHRINE 0.25% -1:200000 IJ SOLN
INTRAMUSCULAR | Status: DC | PRN
  Administered 2016-09-26: 14 mL

## 2016-09-26 MED ORDER — SUCCINYLCHOLINE CHLORIDE 200 MG/10ML IV SOSY
PREFILLED_SYRINGE | INTRAVENOUS | Status: DC | PRN
  Administered 2016-09-26: 100 mg via INTRAVENOUS

## 2016-09-26 MED ORDER — FENTANYL CITRATE (PF) 100 MCG/2ML IJ SOLN
INTRAMUSCULAR | Status: AC
  Filled 2016-09-26: qty 2

## 2016-09-26 MED ORDER — PIPERACILLIN-TAZOBACTAM 3.375 G IVPB
3.3750 g | Freq: Once | INTRAVENOUS | Status: AC
  Filled 2016-09-26: qty 50

## 2016-09-26 MED ORDER — PHENYLEPHRINE HCL 10 MG/ML IJ SOLN
INTRAMUSCULAR | Status: DC | PRN
  Administered 2016-09-26: 80 ug via INTRAVENOUS

## 2016-09-26 MED ORDER — SUGAMMADEX SODIUM 200 MG/2ML IV SOLN
INTRAVENOUS | Status: AC
  Filled 2016-09-26: qty 2

## 2016-09-26 MED ORDER — KCL IN DEXTROSE-NACL 20-5-0.9 MEQ/L-%-% IV SOLN
INTRAVENOUS | Status: DC
  Administered 2016-09-26 – 2016-09-29 (×5): via INTRAVENOUS
  Filled 2016-09-26 (×8): qty 1000

## 2016-09-26 MED ORDER — SUGAMMADEX SODIUM 200 MG/2ML IV SOLN
INTRAVENOUS | Status: DC | PRN
  Administered 2016-09-26: 200 mg via INTRAVENOUS

## 2016-09-26 MED ORDER — HYDROMORPHONE HCL 1 MG/ML IJ SOLN
0.2500 mg | INTRAMUSCULAR | Status: DC | PRN
  Administered 2016-09-26: 0.25 mg via INTRAVENOUS
  Administered 2016-09-26: 0.5 mg via INTRAVENOUS
  Administered 2016-09-26: 0.25 mg via INTRAVENOUS
  Administered 2016-09-26 (×2): 0.5 mg via INTRAVENOUS

## 2016-09-26 MED ORDER — ONDANSETRON HCL 4 MG/2ML IJ SOLN
INTRAMUSCULAR | Status: AC
  Filled 2016-09-26: qty 2

## 2016-09-26 MED ORDER — FENTANYL CITRATE (PF) 100 MCG/2ML IJ SOLN
INTRAMUSCULAR | Status: DC | PRN
  Administered 2016-09-26 (×2): 50 ug via INTRAVENOUS

## 2016-09-26 MED ORDER — DEXAMETHASONE SODIUM PHOSPHATE 10 MG/ML IJ SOLN
INTRAMUSCULAR | Status: AC
  Filled 2016-09-26: qty 1

## 2016-09-26 MED ORDER — PIPERACILLIN-TAZOBACTAM 3.375 G IVPB
3.3750 g | Freq: Three times a day (TID) | INTRAVENOUS | Status: DC
  Administered 2016-09-26 – 2016-09-27 (×4): 3.375 g via INTRAVENOUS
  Filled 2016-09-26 (×4): qty 50

## 2016-09-26 MED ORDER — SODIUM CHLORIDE 0.9% FLUSH: 10.0000 mL | INTRAVENOUS | Status: DC | PRN

## 2016-09-26 MED ORDER — PHENYLEPHRINE 40 MCG/ML (10ML) SYRINGE FOR IV PUSH (FOR BLOOD PRESSURE SUPPORT)
PREFILLED_SYRINGE | INTRAVENOUS | Status: AC
Start: 2016-09-26 — End: 2016-09-26
  Filled 2016-09-26: qty 10

## 2016-09-26 MED ORDER — ACETAMINOPHEN 10 MG/ML IV SOLN
1000.0000 mg | Freq: Four times a day (QID) | INTRAVENOUS | Status: AC
  Administered 2016-09-26 – 2016-09-27 (×3): 1000 mg via INTRAVENOUS
  Filled 2016-09-26 (×4): qty 100

## 2016-09-26 MED ORDER — PROMETHAZINE HCL 25 MG/ML IJ SOLN: 6.2500 mg | INTRAMUSCULAR | Status: DC | PRN

## 2016-09-26 MED ORDER — MIDAZOLAM HCL 5 MG/5ML IJ SOLN
INTRAMUSCULAR | Status: DC | PRN
Start: 2016-09-26 — End: 2016-09-26
  Administered 2016-09-26 (×2): 2 mg via INTRAVENOUS

## 2016-09-26 MED ORDER — PIPERACILLIN-TAZOBACTAM 3.375 G IVPB 30 MIN
3.3750 g | Freq: Three times a day (TID) | INTRAVENOUS | Status: DC
  Filled 2016-09-26: qty 50

## 2016-09-26 MED ORDER — SODIUM CHLORIDE 0.9 % IV BOLUS (SEPSIS)
500.0000 mL | Freq: Once | INTRAVENOUS | Status: AC
  Administered 2016-09-26: 500 mL via INTRAVENOUS

## 2016-09-26 MED ORDER — ACETAMINOPHEN 10 MG/ML IV SOLN
1000.0000 mg | Freq: Once | INTRAVENOUS | Status: AC
  Administered 2016-09-26: 1000 mg via INTRAVENOUS

## 2016-09-26 MED ORDER — BUPIVACAINE HCL (PF) 0.25 % IJ SOLN
INTRAMUSCULAR | Status: AC
  Filled 2016-09-26: qty 30

## 2016-09-26 SURGICAL SUPPLY — 44 items
APPLIER CLIP 5 13 M/L LIGAMAX5 (MISCELLANEOUS)
APPLIER CLIP ROT 10 11.4 M/L (STAPLE)
CABLE HIGH FREQUENCY MONO STRZ (ELECTRODE) ×3 IMPLANT
CHLORAPREP W/TINT 26ML (MISCELLANEOUS) ×3 IMPLANT
CLIP APPLIE 5 13 M/L LIGAMAX5 (MISCELLANEOUS) IMPLANT
CLIP APPLIE ROT 10 11.4 M/L (STAPLE) IMPLANT
COVER MAYO STAND STRL (DRAPES) ×3 IMPLANT
COVER SURGICAL LIGHT HANDLE (MISCELLANEOUS) ×3 IMPLANT
CUTTER FLEX LINEAR 45M (STAPLE) IMPLANT
DECANTER SPIKE VIAL GLASS SM (MISCELLANEOUS) ×3 IMPLANT
DERMABOND ADVANCED (GAUZE/BANDAGES/DRESSINGS) ×2
DERMABOND ADVANCED .7 DNX12 (GAUZE/BANDAGES/DRESSINGS) ×1 IMPLANT
DRAIN CHANNEL 19F RND (DRAIN) ×3 IMPLANT
DRAPE LAPAROSCOPIC ABDOMINAL (DRAPES) ×3 IMPLANT
DRSG TEGADERM 4X4.75 (GAUZE/BANDAGES/DRESSINGS) ×3 IMPLANT
ELECT REM PT RETURN 9FT ADLT (ELECTROSURGICAL) ×3
ELECTRODE REM PT RTRN 9FT ADLT (ELECTROSURGICAL) ×1 IMPLANT
EVACUATOR SILICONE 100CC (DRAIN) ×3 IMPLANT
GLOVE BIOGEL PI IND STRL 7.5 (GLOVE) ×1 IMPLANT
GLOVE BIOGEL PI INDICATOR 7.5 (GLOVE) ×2
GLOVE ECLIPSE 7.5 STRL STRAW (GLOVE) ×3 IMPLANT
GOWN STRL REUS W/TWL XL LVL3 (GOWN DISPOSABLE) ×12 IMPLANT
IRRIG SUCT STRYKERFLOW 2 WTIP (MISCELLANEOUS) ×3
IRRIGATION SUCT STRKRFLW 2 WTP (MISCELLANEOUS) ×1 IMPLANT
KIT BASIN OR (CUSTOM PROCEDURE TRAY) ×6 IMPLANT
POUCH SPECIMEN RETRIEVAL 10MM (ENDOMECHANICALS) ×3 IMPLANT
RELOAD 45 VASCULAR/THIN (ENDOMECHANICALS) IMPLANT
RELOAD STAPLE TA45 3.5 REG BLU (ENDOMECHANICALS) IMPLANT
SCISSORS LAP 5X35 DISP (ENDOMECHANICALS) ×3 IMPLANT
SHEARS HARMONIC ACE PLUS 36CM (ENDOMECHANICALS) IMPLANT
SLEEVE XCEL OPT CAN 5 100 (ENDOMECHANICALS) ×6 IMPLANT
SOLUTION ANTI FOG 6CC (MISCELLANEOUS) ×3 IMPLANT
SPONGE LAP 18X18 X RAY DECT (DISPOSABLE) ×3 IMPLANT
STAPLER VISISTAT 35W (STAPLE) ×3 IMPLANT
SUT ETHILON 2 0 PS N (SUTURE) ×3 IMPLANT
SUT MNCRL AB 4-0 PS2 18 (SUTURE) ×6 IMPLANT
SWAB CULTURE ESWAB REG 1ML (MISCELLANEOUS) ×6 IMPLANT
TOWEL OR 17X26 10 PK STRL BLUE (TOWEL DISPOSABLE) ×6 IMPLANT
TOWEL OR NON WOVEN STRL DISP B (DISPOSABLE) ×3 IMPLANT
TRAY FOLEY W/METER SILVER 16FR (SET/KITS/TRAYS/PACK) ×3 IMPLANT
TRAY LAPAROSCOPIC (CUSTOM PROCEDURE TRAY) ×3 IMPLANT
TROCAR BLADELESS OPT 5 100 (ENDOMECHANICALS) ×3 IMPLANT
TROCAR XCEL BLUNT TIP 100MML (ENDOMECHANICALS) ×3 IMPLANT
TUBING INSUF HEATED (TUBING) ×3 IMPLANT

## 2016-09-26 NOTE — Anesthesia Preprocedure Evaluation (Signed)
Anesthesia Evaluation  Patient identified by MRN, date of birth, ID band Patient awake    Reviewed: Allergy & Precautions, NPO status , Patient's Chart, lab work & pertinent test results  History of Anesthesia Complications (+) DIFFICULT AIRWAY  Airway Mallampati: III  TM Distance: >3 FB Neck ROM: Full    Dental  (+) Dental Advisory Given   Pulmonary Current Smoker,    breath sounds clear to auscultation       Cardiovascular hypertension, Pt. on medications + Peripheral Vascular Disease (3.3cm AAA)   Rhythm:Regular Rate:Normal     Neuro/Psych negative neurological ROS     GI/Hepatic Neg liver ROS, Acute appendicitis   Endo/Other  negative endocrine ROS  Renal/GU negative Renal ROS     Musculoskeletal   Abdominal   Peds  Hematology negative hematology ROS (+)   Anesthesia Other Findings   Reproductive/Obstetrics                             Lab Results  Component Value Date   WBC 30.0 (H) 09/26/2016   HGB 11.7 (L) 09/26/2016   HCT 35.0 (L) 09/26/2016   MCV 91.4 09/26/2016   PLT 450 (H) 09/26/2016   Lab Results  Component Value Date   CREATININE 1.23 09/26/2016   BUN 12 09/26/2016   NA 134 (L) 09/26/2016   K 3.5 09/26/2016   CL 98 (L) 09/26/2016   CO2 25 09/26/2016    Anesthesia Physical  Anesthesia Plan  ASA: II  Anesthesia Plan: General   Post-op Pain Management:    Induction: Intravenous  Airway Management Planned: Oral ETT  Additional Equipment:   Intra-op Plan:   Post-operative Plan: Extubation in OR  Informed Consent: I have reviewed the patients History and Physical, chart, labs and discussed the procedure including the risks, benefits and alternatives for the proposed anesthesia with the patient or authorized representative who has indicated his/her understanding and acceptance.   Dental advisory given  Plan Discussed with: CRNA  Anesthesia Plan  Comments:         Anesthesia Quick Evaluation

## 2016-09-26 NOTE — Progress Notes (Signed)
5 Days Post-Op  Subjective: He is still distended and tender.  He tolerated the oral contrast, had some nausea, but did not vomit.  No fever.  He has a hematoma left side.  Sites all look good.  Objective: Vital signs in last 24 hours: Temp:  [98.8 F (37.1 C)-99.9 F (37.7 C)] 98.8 F (37.1 C) (11/30 0536) Pulse Rate:  [93-109] 102 (11/30 0536) Resp:  [15-16] 16 (11/30 0536) BP: (146-158)/(59-91) 155/59 (11/30 0536) SpO2:  [94 %] 94 % (11/30 0536) Last BM Date: 09/25/16 711 PO Urine 600 BM x 2 Afebrile, VSS NA 134, WBC up to 30K CT scan last PM: Status post appendectomy with small amount of ascites and, pneumoperitoneum. Mild ileus. Extensive mesenteric fat stranding with small amount of free fluid and reactive lymph nodes. Mild peritoneal enhancement and fat stranding the pelvis concerning for early phlegmon/peritonitis. Intake/Output from previous day: 11/29 0701 - 11/30 0700 In: 1911 [P.O.:711; I.V.:1200] Out: 600 [Urine:600] Intake/Output this shift: No intake/output data recorded.  General appearance: alert, cooperative, no distress and he does not look as uncomfortable as yesterday. Resp: clear to auscultation bilaterally GI: Very distended, few hypoactive bowel sounds, still tender and very sore.  No nausea except with the oral contrast.  He did stool some.  Sites ok, Hematoma left side..    Lab Results:   Recent Labs  09/25/16 0456 09/26/16 0502  WBC 21.9* 30.0*  HGB 10.1* 11.7*  HCT 29.7* 35.0*  PLT 344 450*    BMET  Recent Labs  09/24/16 0441 09/26/16 0502  NA 136 134*  K 4.1 3.5  CL 104 98*  CO2 26 25  GLUCOSE 97 110*  BUN 16 12  CREATININE 1.17 1.23  CALCIUM 8.3* 8.3*   PT/INR  Recent Labs  09/26/16 0502  LABPROT 15.4*  INR 1.21     Recent Labs Lab 09/21/16 0925  AST 27  ALT 20  ALKPHOS 95  BILITOT 0.8  PROT 7.1  ALBUMIN 4.2     Lipase     Component Value Date/Time   LIPASE 30 09/21/2016 0925      Studies/Results: Ct Abdomen Pelvis W Contrast  Result Date: 09/25/2016 CLINICAL DATA:  Feeling unwell, moderate tenderness. Fever and leukocytosis, status post appendectomy. EXAM: CT ABDOMEN AND PELVIS WITH CONTRAST TECHNIQUE: Multidetector CT imaging of the abdomen and pelvis was performed using the standard protocol following bolus administration of intravenous contrast. CONTRAST:  173mL ISOVUE-300 IOPAMIDOL (ISOVUE-300) INJECTION 61% COMPARISON:  CT abdomen and pelvis September 21, 2016 FINDINGS: LOWER CHEST: Small RIGHT pleural effusion and bibasilar atelectasis. Heart size is normal. No pericardial effusions. HEPATOBILIARY: Liver and gallbladder are normal. PANCREAS: Normal. SPLEEN: Normal. ADRENALS/URINARY TRACT: Kidneys are orthotopic, demonstrating symmetric enhancement. No nephrolithiasis, hydronephrosis or solid renal masses. The unopacified ureters are normal in course and caliber. Too small to characterize hypodensity lower pole of RIGHT kidney. Delayed imaging through the kidneys demonstrates symmetric prompt contrast excretion within the proximal urinary collecting system. Urinary bladder is partially distended with mild circumferential bladder wall thickening Normal adrenal glands. STOMACH/BOWEL: Small hiatal hernia. Status post appendectomy. Small bowel dilated 3 cm with small and large bowel air-fluid levels. Mild bowel wall thickening. Enteric contrast has not yet reached the distal small bowel. VASCULAR/LYMPHATIC: 3.3 cm aneurysmal infrarenal aorta with mild calcific atherosclerosis. Similar borderline retroperitoneal lymphadenopathy. REPRODUCTIVE: Normal. OTHER: Small amount of scattered ascites without drainable fluid collection. Postoperative scattered pneumoperitoneum. Misty mesentery with small reactive lymph nodes is likely reactive. Mild focal peritoneal thickening and omental  fat stranding in the anterior pelvis. MUSCULOSKELETAL: Thickened, edematous RIGHT transverse abdominis muscle  most compatible with postoperative change. Moderate fat containing inguinal hernias. Mild L5-S1 disc height loss and endplate spurring. IMPRESSION: Status post appendectomy with small amount of ascites and, pneumoperitoneum. Mild ileus. Extensive mesenteric fat stranding with small amount of free fluid and reactive lymph nodes. Mild peritoneal enhancement and fat stranding the pelvis concerning for early phlegmon/peritonitis. New small RIGHT pleural effusion with bibasilar atelectasis. Mild cystitis may be reactive or infectious. 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 Recommend followup by ultrasound in 3 years. This recommendation follows ACR consensus guidelines: White Paper of the ACR Incidental Findings Committee II on Vascular Findings. J Am Coll Radiol 2013; 10:789-794 Electronically Signed   By: Elon Alas M.D.   On: 09/25/2016 22:33   Prior to Admission medications   Medication Sig Start Date End Date Taking? Authorizing Provider  amLODipine (NORVASC) 5 MG tablet Take 5 mg by mouth daily.    Yes Historical Provider, MD  losartan (COZAAR) 100 MG tablet Take 100 mg by mouth daily.   Yes Historical Provider, MD  naproxen (NAPROSYN) 500 MG tablet Take 1 tablet (500 mg total) by mouth every 12 (twelve) hours as needed for mild pain or moderate pain. 09/21/16   Zorion Boston, MD  oxyCODONE (OXY IR/ROXICODONE) 5 MG immediate release tablet Take 1-2 tablets (5-10 mg total) by mouth every 4 (four) hours as needed for moderate pain, severe pain or breakthrough pain. 09/21/16   Skye Boston, MD    Medications: . amLODipine  5 mg Oral Daily  . enoxaparin (LOVENOX) injection  40 mg Subcutaneous Q24H  . feeding supplement  1 Container Oral TID BM  . lip balm  1 application Topical BID  . losartan  50 mg Oral Daily  . nicotine  21 mg Transdermal Daily  . pantoprazole  40 mg Oral BID AC  . sodium chloride flush  3 mL Intravenous Q12H   . lactated ringers 50 mL/hr at 09/26/16 0600     Assessment/Plan Acute appendicitis s/p lap appendectomy 11/25 Dr. Johney Maine  POD 5 -- No fevers but WBC rising still.  Right inguinal hernia-- F/u as OP with Dr. Johney Maine Hypertension  Malnutrition - prealbumin 5.2 today ID: Day 5 antibiotics; day 4 (stop date on Zosyn) restarted this AM 09/26/16 FEN-- BUN/Cr up somewhat, increase IVF/full liquids =>> soft diet as tolerated VTE -- SCD's, Lovenox Dispo-- Ileus with ongoing distension, no fever and ongoing WBC elevation.  CT abdomen and pelvis shows some pneumoperitoneum, ileus, early stranding =>> concerning for early phlegmon/peritonitis. Pharmacy does not want to start TNA till he is 7 days without nutrition, I will put him in for a PICC, and increase fluids,  Resuming Zosyn.  If he does not improve soon, we may need to take back to OR for  Diagnostic laparoscopy.  I have talked with him about this possibility.        LOS: 5 days    Andrewjames Weirauch 09/26/2016 [id number]

## 2016-09-26 NOTE — Anesthesia Procedure Notes (Signed)
Procedure Name: Intubation Date/Time: 09/26/2016 1:01 PM Performed by: Lind Covert Pre-anesthesia Checklist: Patient identified, Emergency Drugs available, Suction available, Patient being monitored and Timeout performed Patient Re-evaluated:Patient Re-evaluated prior to inductionOxygen Delivery Method: Circle system utilized Preoxygenation: Pre-oxygenation with 100% oxygen Intubation Type: IV induction Laryngoscope Size: Mac, 4 and Glidescope Grade View: Grade I Tube type: Oral Tube size: 7.5 mm Number of attempts: 1 Airway Equipment and Method: Video-laryngoscopy and Stylet Placement Confirmation: ETT inserted through vocal cords under direct vision,  positive ETCO2 and breath sounds checked- equal and bilateral Secured at: 23 cm Tube secured with: Tape Dental Injury: Teeth and Oropharynx as per pre-operative assessment  Difficulty Due To: Difficulty was anticipated Future Recommendations: Recommend- induction with short-acting agent, and alternative techniques readily available

## 2016-09-26 NOTE — Op Note (Signed)
Preoperative Diagnosis: Peritonitis status post laparoscopic appendectomy  Postoprative Diagnosis: Same  Procedure: Procedure(s): DIAGNOSTIC LAPAROSCOPY with abdominal lavage and drain placement   Surgeon: Excell Seltzer T   Assistants: None  Anesthesia:  General endotracheal anesthesia  Indications: Patient is 5 days status post emergency laparoscopic appendectomy for acute appendicitis with early perforation. Over the last 3 days he has developed progressively elevated white blood count now to 30,000 today. CT scan early this morning showed no discrete abscess or definite leak although there was evidence of somewhat diffuse peritoneal thickening and possibly developing phlegmon in the right lower quadrant of concern for peritonitis. He still has very significant tenderness in his lower abdomen and particularly right lower quadrant. Low-grade fever as well. With this constellation of findings I recommended proceeding with diagnostic laparoscopy and possible laparotomy to rule out appendiceal stump leak or other possible source of ongoing peritonitis. I discussed the indications and possible outcomes with the patient as well as risks of bleeding, infection or bowel injury and he understands and agrees to proceed.    Procedure Detail:  Patient was brought to the operating room, placed in supine position on the operating table, and general endotracheal anesthesia induced. He was already on broad-spectrum IV antibiotics. The abdomen was widely sterilely prepped and draped. Foley catheter was placed. Patient timeout was performed and correct procedure verified. I opened the previous supraumbilical 5 mm incision and with careful blunt dissection with a Claiborne Billings was able to dissect down through the fascia into the peritoneal cavity. A 5 mm trocar was then passed and pneumoperitoneum established. There was no evidence of trocar injury. There were noted to be matted loops of small bowel with some whitish  and gelatinous exudate. Under direct vision 2 additional 5 mm trochars were placed in the upper mid abdomen and in the left lower quadrant through the previous trocar site. An extensive expiration was performed. I separated loops of small bowel with gentle blunt dissection and the separated easily. There was diffuse whitish exudate particularly in the lower abdomen and right lower quadrant overlying and between loops of bowel. This was patchy. I removed some of this and it was sent for culture. Bowel loops were separated. I identified the sigmoid colon and separated it from small bowel loops. Small bowel loops in the lower abdomen and right lower quadrant were all separated. There were a few clear light green pockets of fluid that were aspirated and drained but no evidence of succus. I identified the right colon and followed this proximally to the cecum and there was additional exudate around the tip of the cecum and laterally and this was irrigated and debrided. I was able to identify with [id number] certainty what I felt was the appendiceal stump and the ileocecal valve was clearly identified near this. There was exudate over this which I left intact but I did not see any evidence of leak and at no point any discrete abscess or certainly any evidence of enteric contents in the peritoneal cavity. After I satisfied with the complete exploration of the abdomen was irrigated with multiple liters of warm saline. A 19 Blake drain was left through the right lower quadrant trocar site and placed in the right paracolic gutter and next to the cecum. Trochars were removed and all CO2 evacuated. Skin incisions were closed with subcutaneous testicular Monocryl and Dermabond. Sponge needle and instrument counts were correct.    Findings: As above  Estimated Blood Loss:  Minimal  Drains: 35 Blake drain left and right lower quadrant  Blood Given: none          Specimens: Peritoneal exudate sent for culture         Complications:  * No complications entered in OR log *         Disposition: PACU - hemodynamically stable.         Condition: stable

## 2016-09-26 NOTE — Progress Notes (Signed)
Initial Nutrition Assessment  DOCUMENTATION CODES:   Not applicable  INTERVENTION:   Currently NPO; possible TPN per pharmacy   NUTRITION DIAGNOSIS:   Inadequate oral intake related to altered GI function as evidenced by NPO status.  GOAL:   Patient will meet greater than or equal to 90% of their needs  MONITOR:   Other (Comment) (Possible TPN )  REASON FOR ASSESSMENT:   Consult New TPN/TNA  ASSESSMENT:   60 y/o male s/p appendectomy 11/25, now with illeus, abdominal distention and bloating, hypoactive bowel sounds, and not passing flatus. Pt scheduled to have exploratory lap today and possibly to start TPN in new couple of days.     Met with patient in room today. Patient reports that he does have an appetite but that he is unable to eat d/t NPO. Patient did eat some food a couple of days ago with no N/V but abdomen became more distended afterwards. Patient has bloating and abdominal distension but reports that it is better than it was yesterday. Pt reports that he does feel the urge to defecate but then nothing happens when he strains. Not passing any flatus. Patient refuses having any feeding tubes but is willing to get PICC line for TPN. Pt reports good appetite pta up until abdominal pain started the day of admit. Spoke to [employer] with general surgery. Pt currently going back into surgery for ex lap today. Most likely going to start TPN post op at some point.    Medications reviewed and include: lovenox, pepcid,zosyn, NaCl with dextrose and KCl infusion, hydromorphone, oxycodone    Labs reviewed: Na 134(L), Cl 98(L), Ca 8.3(L) adj. 9.26 wnl, Alb 2.8(L) WBC 30(H)  Nutrition-Focused physical exam completed. Findings are no fat depletion, no muscle depletion, and no edema.   Diet Order:  Diet - low sodium heart healthy Diet NPO time specified  Skin:  Wound (see comment) (abd incisions )  Last BM:  11/30-small amounts  Height:   Ht Readings from Last 1  Encounters:  09/21/16 _0  (1.778 m)    Weight:   Wt Readings from Last 1 Encounters:  09/21/16 200 lb (90.7 kg)    Ideal Body Weight:     BMI:  Body mass index is 28.7 kg/m.  Estimated Nutritional Needs:   Kcal:  1900-2100kcal/day   Protein:  100-118g/day   Fluid:  2L/day   EDUCATION NEEDS:   No education needs identified at this time  Koleen Distance, RD, LDN

## 2016-09-26 NOTE — Transfer of Care (Signed)
Immediate Anesthesia Transfer of Care Note  Patient: Devin Goodwin  Procedure(s) Performed: Procedure(s): DIAGNOSTIC LAPAROSCOPY (N/A)  Patient Location: PACU  Anesthesia Type:General  Level of Consciousness: sedated  Airway & Oxygen Therapy: Patient Spontanous Breathing and Patient connected to face mask oxygen  Post-op Assessment: Report given to RN and Post -op Vital signs reviewed and stable  Post vital signs: Reviewed and stable  Last Vitals:  Vitals:   09/26/16 0536 09/26/16 1129  BP: (!) 155/59 134/70  Pulse: (!) 102 88  Resp: 16 15  Temp: 37.1 C 37 C    Last Pain:  Vitals:   09/26/16 1129  TempSrc: Oral  PainSc:       Patients Stated Pain Goal: 3 ([id number])  Complications: No apparent anesthesia complications

## 2016-09-26 NOTE — Progress Notes (Signed)
Peripherally Inserted Central Catheter/Midline Placement  The IV Nurse has discussed with the patient and/or persons authorized to consent for the patient, the purpose of this procedure and the potential benefits and risks involved with this procedure.  The benefits include less needle sticks, lab draws from the catheter, and the patient may be discharged home with the catheter. Risks include, but not limited to, infection, bleeding, blood clot (thrombus formation), and puncture of an artery; nerve damage and irregular heartbeat and possibility to perform a PICC exchange if needed/ordered by physician.  Alternatives to this procedure were also discussed.  Bard Power PICC patient education guide, fact sheet on infection prevention and patient information card has been provided to patient /or left at bedside.    PICC/Midline Placement Documentation        Devin Goodwin 09/26/2016, 6:17 PM

## 2016-09-26 NOTE — Anesthesia Postprocedure Evaluation (Signed)
Anesthesia Post Note  Patient: Devin Goodwin  Procedure(s) Performed: Procedure(s) (LRB): DIAGNOSTIC LAPAROSCOPY (N/A)  Patient location during evaluation: PACU Anesthesia Type: General Level of consciousness: sedated and patient cooperative Pain management: pain level controlled Vital Signs Assessment: post-procedure vital signs reviewed and stable Respiratory status: spontaneous breathing Cardiovascular status: stable Anesthetic complications: no    Last Vitals:  Vitals:   09/26/16 1537 09/26/16 1545  BP:  133/73  Pulse: 92 89  Resp: 13 11  Temp:      Last Pain:  Vitals:   09/26/16 1537  TempSrc:   PainSc: [place]

## 2016-09-27 ENCOUNTER — Encounter (HOSPITAL_COMMUNITY): Payer: Self-pay | Admitting: General Surgery

## 2016-09-27 LAB — CBC
HCT: 27.6 % — ABNORMAL LOW (ref 39.0–52.0)
Hemoglobin: 9.4 g/dL — ABNORMAL LOW (ref 13.0–17.0)
MCH: 30.9 pg (ref 26.0–34.0)
MCHC: 34.1 g/dL (ref 30.0–36.0)
MCV: 90.8 fL (ref 78.0–100.0)
PLATELETS: 438 10*3/uL — AB (ref 150–400)
RBC: 3.04 MIL/uL — ABNORMAL LOW (ref 4.22–5.81)
RDW: 13.8 % (ref 11.5–15.5)
WBC: 22.7 10*3/uL — ABNORMAL HIGH (ref 4.0–10.5)

## 2016-09-27 LAB — BASIC METABOLIC PANEL
Anion gap: 5 (ref 5–15)
BUN: 15 mg/dL (ref 6–20)
CALCIUM: 7.6 mg/dL — AB (ref 8.9–10.3)
CO2: 28 mmol/L (ref 22–32)
Chloride: 103 mmol/L (ref 101–111)
Creatinine, Ser: 0.99 mg/dL (ref 0.61–1.24)
GFR calc Af Amer: >60 mL/min (ref >60)
GLUCOSE: 171 mg/dL — AB (ref 65–99)
Potassium: 3.8 mmol/L (ref 3.5–5.1)
Sodium: 136 mmol/L (ref 135–145)

## 2016-09-27 MED ORDER — FLUCONAZOLE IN SODIUM CHLORIDE 200-0.9 MG/100ML-% IV SOLN
200.0000 mg | INTRAVENOUS | Status: DC
  Administered 2016-09-27 – 2016-10-08 (×12): 200 mg via INTRAVENOUS
  Filled 2016-09-27 (×13): qty 100

## 2016-09-27 MED ORDER — LACTATED RINGERS IV BOLUS (SEPSIS): 1000.0000 mL | Freq: Three times a day (TID) | INTRAVENOUS | Status: AC | PRN

## 2016-09-27 MED ORDER — ERTAPENEM SODIUM 1 G IJ SOLR
1.0000 g | INTRAMUSCULAR | Status: DC
  Administered 2016-09-27 – 2016-09-30 (×4): 1 g via INTRAVENOUS
  Filled 2016-09-27 (×5): qty 1

## 2016-09-27 NOTE — Progress Notes (Signed)
[address] [place], [place] [address] Phone: [phone] FAX: [phone]   Devin Goodwin [id number] 09-29-1956    Problem List:   Principal Problem:   Acute appendicitis s/p lap appendectomy 09/21/2016 Active Problems:   AAA (abdominal aortic aneurysm)    Essential hypertension   Carcinoid tumor of rectum s/p removal 2012   Hyperlipidemia   Tobacco abuse   Constipation, chronic   Inguinal hernia, right   Heartburn   1 Day Post-Op    SURGERY:  09/26/2016  Preoperative Diagnosis: Peritonitis status post laparoscopic appendectomy  Postoprative Diagnosis: Same  Procedure: Procedure(s): DIAGNOSTIC LAPAROSCOPY with abdominal lavage and drain placement   Surgeon: Excell Seltzer T   SURGERY: 09/21/2016  POST-OPERATIVE DIAGNOSIS:    Early perforated appendicitis History of rectal carcinoid   PROCEDURE:    APPENDECTOMY LAPAROSCOPIC EXAMINATION UNDER ANESTHESIA  SURGEON:  Surgeon(s): Demeco Boston, MD  Procedure(s): APPENDECTOMY LAPAROSCOPIC   Assessment  Gradually recovering with no major abscess/leak  Plan:  PO liquids, adv to fulls as tolerated.  Diff Abx regimen - Invanz/fluconazole.  F/u cultures  Continue Blake drain   Pain control.   IV fluids.  Nausea control.  Heartburn/reflux control.  PPI.  No evidence of carcinoids recurrence in the perirectal/anal region.  Overdue for screening colonoscopy - reconsider as an outpatient.    Incidental right lower hernia noted.  He has had a couple episodes of sharp groin and testicular pain.  Do not know fits related that.  He most likely would benefit from elective surgical repair of this in the future.  We can discuss with him as an outpatient.  -VTE prophylaxis- SCDs, etc  -mobilize as tolerated to help recovery  [name], M.D., F.A.C.S. Gastrointestinal and Minimally Invasive Surgery [facility] [address] [place], [place] [address] [phone] Main / Paging   09/27/2016  CARE TEAM:  PCP: Joanette Gula, MD  Outpatient Care Team: Patient Care Team: Joanette Gula, MD as PCP - General (Internal Medicine) Janie Morning, MD as Consulting Physician (Gastroenterology) Cathe Mons, MD as Consulting Physician (Colon and Rectal Surgery) Jaequan Boston, MD as Consulting Physician (General Surgery)  Inpatient Treatment Team: Treatment Team: Attending Provider: Nolon Nations, MD; Consulting Physician: Nolon Nations, MD; Technician: Resa Miner Spaugh, NT; Technician: Leda Quail, NT; Registered Nurse: Jeannie Fend, RN  Subjective:  "I'm HUNGRY!"  Walked in hallways.  Wife at bedside.  Objective:  Vital signs:  Vitals:   09/26/16 2137 09/27/16 0112 09/27/16 0518 09/27/16 0924  BP: 133/63 123/62 131/68 129/72  Pulse: 83 81 76   Resp: _0 Temp: 98 F (36.7 C) 97.6 F (36.4 C) 97.7 F (36.5 C)   TempSrc: Oral Oral Oral   SpO2: 95% 95% 96%   Weight:      Height:        Last BM Date: 09/26/16  Intake/Output   Yesterday:  11/30 0701 - 12/01 0700 In: 5852 [I.V.:3250; IV Piggyback:400] Out: 7782 [Urine:1375; Drains:60; Blood:20] This shift:  No intake/output data recorded.  Bowel function:  Flatus: YES  BM:  No  Drain: Serous   Physical Exam:  General: Pt awake/alert/oriented x4 in No acute distress Eyes: PERRL, normal EOM.  Sclera clear.  No icterus Neuro: CN II-XII intact w/o focal sensory/motor deficits. Lymph: No head/neck/groin lymphadenopathy Psych:  No delerium/psychosis/paranoia HENT: Normocephalic, Mucus membranes moist.  No thrush Neck: Supple, No tracheal deviation Chest: No  chest wall pain w good excursion CV:  Pulses intact.  Regular rhythm MS: Normal AROM mjr joints.  No obvious deformity Abdomen: Soft.  Mildy distended.  Mildly tender at incisions only.  No evidence of peritonitis.  No  incarcerated hernias. Ext:  SCDs BLE.  No mjr edema.  No cyanosis Skin: No petechiae / purpura  Results:   Labs: Results for orders placed or performed during the hospital encounter of 09/21/16 (from the past 48 hour(s))  CBC     Status: Abnormal   Collection Time: 09/26/16  5:02 AM  Result Value Ref Range   WBC 30.0 (H) 4.0 - 10.5 K/uL   RBC 3.83 (L) 4.22 - 5.81 MIL/uL   Hemoglobin 11.7 (L) 13.0 - 17.0 g/dL   HCT 35.0 (L) 39.0 - 52.0 %   MCV 91.4 78.0 - 100.0 fL   MCH 30.5 26.0 - 34.0 pg   MCHC 33.4 30.0 - 36.0 g/dL   RDW 13.8 11.5 - 15.5 %   Platelets 450 (H) 150 - 400 K/uL  Basic metabolic panel     Status: Abnormal   Collection Time: 09/26/16  5:02 AM  Result Value Ref Range   Sodium 134 (L) 135 - 145 mmol/L   Potassium 3.5 3.5 - 5.1 mmol/L   Chloride 98 (L) 101 - 111 mmol/L   CO2 25 22 - 32 mmol/L   Glucose, Bld 110 (H) 65 - 99 mg/dL   BUN 12 6 - 20 mg/dL   Creatinine, Ser 1.23 0.61 - 1.24 mg/dL   Calcium 8.3 (L) 8.9 - 10.3 mg/dL   GFR calc non Af Amer >60 >60 mL/min   GFR calc Af Amer >60 >60 mL/min    Comment: (NOTE) The eGFR has been calculated using the CKD EPI equation. This calculation has not been validated in all clinical situations. eGFR's persistently <60 mL/min signify possible Chronic Kidney Disease.    Anion gap 11 5 - 15  Protime-INR     Status: Abnormal   Collection Time: 09/26/16  5:02 AM  Result Value Ref Range   Prothrombin Time 15.4 (H) 11.4 - 15.2 seconds   INR 1.21   APTT     Status: None   Collection Time: 09/26/16  5:02 AM  Result Value Ref Range   aPTT 29 24 - 36 seconds  Hepatic function panel     Status: Abnormal   Collection Time: 09/26/16  5:02 AM  Result Value Ref Range   Total Protein 6.1 (L) 6.5 - 8.1 g/dL   Albumin 2.8 (L) 3.5 - 5.0 g/dL   AST 21 15 - 41 U/L   ALT 18 17 - 63 U/L   Alkaline Phosphatase 104 38 - 126 U/L   Total Bilirubin 1.2 0.3 - 1.2 mg/dL   Bilirubin, Direct 0.3 0.1 - 0.5 mg/dL   Indirect Bilirubin 0.9  0.3 - 0.9 mg/dL  Prealbumin     Status: Abnormal   Collection Time: 09/26/16  8:41 AM  Result Value Ref Range   Prealbumin 5.2 (L) 18 - 38 mg/dL    Comment: Performed at [facility]  Surgical pcr screen     Status: Abnormal   Collection Time: 09/26/16 11:35 AM  Result Value Ref Range   MRSA, PCR INVALID RESULTS, SPECIMEN SENT FOR CULTURE (A) NEGATIVE    Comment: RESULT CALLED TO, READ BACK BY AND VERIFIED WITH: AFTERHOURS AT 1600 ON 09/26/16 BY S.VANHOORNE    Staphylococcus aureus INVALID RESULTS, SPECIMEN SENT FOR CULTURE (A) NEGATIVE  Comment: RESULT CALLED TO, READ BACK BY AND VERIFIED WITH: AFTERHOURS AT 1600 ON 09/26/16 BY S.VANHOORNE        The Xpert SA Assay (FDA approved for NASAL specimens in patients over 70 years of age), is one component of a comprehensive surveillance program.  Test performance has been validated by [facility] for patients greater than or equal to 38 year old. It is not intended to diagnose infection nor to guide or monitor treatment.   MRSA culture     Status: None (Preliminary result)   Collection Time: 09/26/16 11:35 AM  Result Value Ref Range   Specimen Description NASAL SWAB    Special Requests CONFIRM INVALID MRSA PCR RESULT    Culture      CULTURE REINCUBATED FOR BETTER GROWTH Performed at [facility]    Report Status PENDING   Aerobic/Anaerobic Culture (surgical/deep wound)     Status: None (Preliminary result)   Collection Time: 09/26/16  1:55 PM  Result Value Ref Range   Specimen Description TISSUE PERITONEAL    Special Requests NONE    Gram Stain      FEW WBC PRESENT,BOTH PMN AND MONONUCLEAR NO ORGANISMS SEEN Gram Stain Report Called to,Read Back By and Verified With: HERRSHAFT,M @ 4128 ON [id number] BY POTEAT,S    Culture PENDING    Report Status PENDING   CBC     Status: Abnormal   Collection Time: 09/27/16  4:36 AM  Result Value Ref Range   WBC 22.7 (H) 4.0 - 10.5 K/uL   RBC 3.04 (L) 4.22 - 5.81 MIL/uL    Hemoglobin 9.4 (L) 13.0 - 17.0 g/dL   HCT 27.6 (L) 39.0 - 52.0 %   MCV 90.8 78.0 - 100.0 fL   MCH 30.9 26.0 - 34.0 pg   MCHC 34.1 30.0 - 36.0 g/dL   RDW 13.8 11.5 - 15.5 %   Platelets 438 (H) 150 - 400 K/uL  Basic metabolic panel     Status: Abnormal   Collection Time: 09/27/16  4:36 AM  Result Value Ref Range   Sodium 136 135 - 145 mmol/L   Potassium 3.8 3.5 - 5.1 mmol/L   Chloride 103 101 - 111 mmol/L   CO2 28 22 - 32 mmol/L   Glucose, Bld 171 (H) 65 - 99 mg/dL   BUN 15 6 - 20 mg/dL   Creatinine, Ser 0.99 0.61 - 1.24 mg/dL   Calcium 7.6 (L) 8.9 - 10.3 mg/dL   GFR calc non Af Amer >60 >60 mL/min   GFR calc Af Amer >60 >60 mL/min    Comment: (NOTE) The eGFR has been calculated using the CKD EPI equation. This calculation has not been validated in all clinical situations. eGFR's persistently <60 mL/min signify possible Chronic Kidney Disease.    Anion gap 5 5 - 15    Imaging / Studies: Ct Abdomen Pelvis W Contrast  Result Date: 09/25/2016 CLINICAL DATA:  Feeling unwell, moderate tenderness. Fever and leukocytosis, status post appendectomy. EXAM: CT ABDOMEN AND PELVIS WITH CONTRAST TECHNIQUE: Multidetector CT imaging of the abdomen and pelvis was performed using the standard protocol following bolus administration of intravenous contrast. CONTRAST:  149m ISOVUE-300 IOPAMIDOL (ISOVUE-300) INJECTION 61% COMPARISON:  CT abdomen and pelvis September 21, 2016 FINDINGS: LOWER CHEST: Small RIGHT pleural effusion and bibasilar atelectasis. Heart size is normal. No pericardial effusions. HEPATOBILIARY: Liver and gallbladder are normal. PANCREAS: Normal. SPLEEN: Normal. ADRENALS/URINARY TRACT: Kidneys are orthotopic, demonstrating symmetric enhancement. No nephrolithiasis, hydronephrosis or solid renal masses. The  unopacified ureters are normal in course and caliber. Too small to characterize hypodensity lower pole of RIGHT kidney. Delayed imaging through the kidneys demonstrates symmetric  prompt contrast excretion within the proximal urinary collecting system. Urinary bladder is partially distended with mild circumferential bladder wall thickening Normal adrenal glands. STOMACH/BOWEL: Small hiatal hernia. Status post appendectomy. Small bowel dilated 3 cm with small and large bowel air-fluid levels. Mild bowel wall thickening. Enteric contrast has not yet reached the distal small bowel. VASCULAR/LYMPHATIC: 3.3 cm aneurysmal infrarenal aorta with mild calcific atherosclerosis. Similar borderline retroperitoneal lymphadenopathy. REPRODUCTIVE: Normal. OTHER: Small amount of scattered ascites without drainable fluid collection. Postoperative scattered pneumoperitoneum. Misty mesentery with small reactive lymph nodes is likely reactive. Mild focal peritoneal thickening and omental fat stranding in the anterior pelvis. MUSCULOSKELETAL: Thickened, edematous RIGHT transverse abdominis muscle most compatible with postoperative change. Moderate fat containing inguinal hernias. Mild L5-S1 disc height loss and endplate spurring. IMPRESSION: Status post appendectomy with small amount of ascites and, pneumoperitoneum. Mild ileus. Extensive mesenteric fat stranding with small amount of free fluid and reactive lymph nodes. Mild peritoneal enhancement and fat stranding the pelvis concerning for early phlegmon/peritonitis. New small RIGHT pleural effusion with bibasilar atelectasis. Mild cystitis may be reactive or infectious. 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 Recommend followup by ultrasound in 3 years. This recommendation follows ACR consensus guidelines: White Paper of the ACR Incidental Findings Committee II on Vascular Findings. J Am Coll Radiol 2013; 10:789-794 Electronically Signed   By: Elon Alas M.D.   On: 09/25/2016 22:33    Medications / Allergies: per chart  Antibiotics: Anti-infectives    Start     Dose/Rate Route Frequency Ordered Stop   09/26/16 1645  piperacillin-tazobactam (ZOSYN)  IVPB 3.375 g     3.375 g 12.5 mL/hr over 240 Minutes Intravenous  Once 09/26/16 1639 09/26/16 2045   09/26/16 1200  piperacillin-tazobactam (ZOSYN) IVPB 3.375 g  Status:  Discontinued     3.375 g 12.5 mL/hr over 240 Minutes Intravenous Every 8 hours 09/26/16 0719 09/26/16 0830   09/26/16 0845  piperacillin-tazobactam (ZOSYN) IVPB 3.375 g     3.375 g 12.5 mL/hr over 240 Minutes Intravenous Every 8 hours 09/26/16 0830     09/22/16 1200  piperacillin-tazobactam (ZOSYN) IVPB 3.375 g     3.375 g 12.5 mL/hr over 240 Minutes Intravenous Every 8 hours 09/22/16 1059 09/25/16 0847   09/21/16 1400  cefoTEtan (CEFOTAN) 2 g in dextrose 5 % 50 mL IVPB     2 g 100 mL/hr over 30 Minutes Intravenous On call to O.R. 09/21/16 1246 09/21/16 1740   09/21/16 1300  cefTRIAXone (ROCEPHIN) 2 g in dextrose 5 % 50 mL IVPB  Status:  Discontinued    Comments:  Pharmacy may adjust dosing strength / duration / interval for maximal efficacy   2 g 100 mL/hr over 30 Minutes Intravenous Every 24 hours 09/21/16 1136 09/22/16 1056   09/21/16 1130  cefoTEtan (CEFOTAN) 2 g in dextrose 5 % 50 mL IVPB  Status:  Discontinued     2 g 100 mL/hr over 30 Minutes Intravenous On call to O.R. 09/21/16 1125 09/21/16 1247        Note: Portions of this report may have been transcribed using voice recognition software. Every effort was made to ensure accuracy; however, inadvertent computerized transcription errors may be present.   Any transcriptional errors that result from this process are unintentional.     [name], M.D., F.A.C.S. Gastrointestinal and Minimally Invasive Surgery [facility] [address], [place], [place] [address] [phone] Main / Paging   09/27/2016

## 2016-09-28 LAB — MRSA CULTURE
Culture: NOT DETECTED
SPECIAL REQUESTS: INVALID

## 2016-09-28 LAB — CREATININE, SERUM
CREATININE: 0.92 mg/dL (ref 0.61–1.24)
GFR calc Af Amer: >60 mL/min (ref >60)

## 2016-09-28 MED ORDER — ACETAMINOPHEN 325 MG PO TABS
650.0000 mg | ORAL_TABLET | Freq: Four times a day (QID) | ORAL | Status: DC | PRN
  Administered 2016-09-29 – 2016-10-04 (×10): 650 mg via ORAL
  Filled 2016-09-28 (×10): qty 2

## 2016-09-28 NOTE — Progress Notes (Signed)
Patient ID: STATON MARKEY, male   DOB: 11/29/1955, 60 y.o.   MRN: [id number] [facility] Progress Note:   2 Days Post-Op  Subjective: Mental status is clear.  Paucity of flatus and feeling distended Objective: Vital signs in last 24 hours: Temp:  [97.9 F (36.6 C)-98.1 F (36.7 C)] 97.9 F (36.6 C) (12/02 0612) Pulse Rate:  [78-84] 78 (12/02 0612) Resp:  [16-18] 18 (12/02 0612) BP: (126-155)/(66-84) 152/79 (12/02 0936) SpO2:  [92 %-98 %] 98 % (12/02 0612)  Intake/Output from previous day: 12/01 0701 - 12/02 0700 In: 2592.5 [P.O.:1080; I.V.:1512.5] Out: 1197 [Urine:900; Drains:295; Stool:2] Intake/Output this shift: No intake/output data recorded.  Physical Exam: Work of breathing is normal.  Has right sided penile swelling that is likely due to edema tracking alongside the JP in the right lower quadrant.    Lab Results:  Results for orders placed or performed during the hospital encounter of 09/21/16 (from the past 48 hour(s))  Aerobic/Anaerobic Culture (surgical/deep wound)     Status: None (Preliminary result)   Collection Time: 09/26/16  1:55 PM  Result Value Ref Range   Specimen Description TISSUE PERITONEAL    Special Requests NONE    Gram Stain      FEW WBC PRESENT,BOTH PMN AND MONONUCLEAR NO ORGANISMS SEEN Gram Stain Report Called to,Read Back By and Verified With: HERRSHAFT,M @ 3419 ON [id number] BY POTEAT,S    Culture      NO GROWTH 1 DAY Performed at [facility]    Report Status PENDING   CBC     Status: Abnormal   Collection Time: 09/27/16  4:36 AM  Result Value Ref Range   WBC 22.7 (H) 4.0 - 10.5 K/uL   RBC 3.04 (L) 4.22 - 5.81 MIL/uL   Hemoglobin 9.4 (L) 13.0 - 17.0 g/dL   HCT 27.6 (L) 39.0 - 52.0 %   MCV 90.8 78.0 - 100.0 fL   MCH 30.9 26.0 - 34.0 pg   MCHC 34.1 30.0 - 36.0 g/dL   RDW 13.8 11.5 - 15.5 %   Platelets 438 (H) 150 - 400 K/uL  Basic metabolic panel     Status: Abnormal   Collection Time: 09/27/16  4:36 AM  Result  Value Ref Range   Sodium 136 135 - 145 mmol/L   Potassium 3.8 3.5 - 5.1 mmol/L   Chloride 103 101 - 111 mmol/L   CO2 28 22 - 32 mmol/L   Glucose, Bld 171 (H) 65 - 99 mg/dL   BUN 15 6 - 20 mg/dL   Creatinine, Ser 0.99 0.61 - 1.24 mg/dL   Calcium 7.6 (L) 8.9 - 10.3 mg/dL   GFR calc non Af Amer >60 >60 mL/min   GFR calc Af Amer >60 >60 mL/min    Comment: (NOTE) The eGFR has been calculated using the CKD EPI equation. This calculation has not been validated in all clinical situations. eGFR's persistently <60 mL/min signify possible Chronic Kidney Disease.    Anion gap 5 5 - 15  Creatinine, serum     Status: None   Collection Time: 09/28/16  5:06 AM  Result Value Ref Range   Creatinine, Ser 0.92 0.61 - 1.24 mg/dL   GFR calc non Af Amer >60 >60 mL/min   GFR calc Af Amer >60 >60 mL/min    Comment: (NOTE) The eGFR has been calculated using the CKD EPI equation. This calculation has not been validated in all clinical situations. eGFR's persistently <60 mL/min signify possible Chronic Kidney  Disease.     Radiology/Results: No results found.  Anti-infectives: Anti-infectives    Start     Dose/Rate Route Frequency Ordered Stop   09/27/16 1100  ertapenem (INVANZ) 1 g in sodium chloride 0.9 % 50 mL IVPB     1 g 100 mL/hr over 30 Minutes Intravenous Every 24 hours 09/27/16 1005     09/27/16 1100  fluconazole (DIFLUCAN) IVPB 200 mg     200 mg 100 mL/hr over 60 Minutes Intravenous Every 24 hours 09/27/16 1005     09/26/16 1645  piperacillin-tazobactam (ZOSYN) IVPB 3.375 g     3.375 g 12.5 mL/hr over 240 Minutes Intravenous  Once 09/26/16 1639 09/26/16 2045   09/26/16 1200  piperacillin-tazobactam (ZOSYN) IVPB 3.375 g  Status:  Discontinued     3.375 g 12.5 mL/hr over 240 Minutes Intravenous Every 8 hours 09/26/16 0719 09/26/16 0830   09/26/16 0845  piperacillin-tazobactam (ZOSYN) IVPB 3.375 g  Status:  Discontinued     3.375 g 12.5 mL/hr over 240 Minutes Intravenous Every 8 hours  09/26/16 0830 09/27/16 1005   09/22/16 1200  piperacillin-tazobactam (ZOSYN) IVPB 3.375 g     3.375 g 12.5 mL/hr over 240 Minutes Intravenous Every 8 hours 09/22/16 1059 09/25/16 0847   09/21/16 1400  cefoTEtan (CEFOTAN) 2 g in dextrose 5 % 50 mL IVPB     2 g 100 mL/hr over 30 Minutes Intravenous On call to O.R. 09/21/16 1246 09/21/16 1740   09/21/16 1300  cefTRIAXone (ROCEPHIN) 2 g in dextrose 5 % 50 mL IVPB  Status:  Discontinued    Comments:  Pharmacy may adjust dosing strength / duration / interval for maximal efficacy   2 g 100 mL/hr over 30 Minutes Intravenous Every 24 hours 09/21/16 1136 09/22/16 1056   09/21/16 1130  cefoTEtan (CEFOTAN) 2 g in dextrose 5 % 50 mL IVPB  Status:  Discontinued     2 g 100 mL/hr over 30 Minutes Intravenous On call to O.R. 09/21/16 1125 09/21/16 1247      Assessment/Plan: Problem List: Patient Active Problem List   Diagnosis Date Noted  . Inguinal hernia, right 09/22/2016  . Heartburn 09/22/2016  . Acute appendicitis s/p lap appendectomy 09/21/2016 09/21/2016  . AAA (abdominal aortic aneurysm)  09/21/2016  . Essential hypertension 09/21/2016  . Tobacco abuse 09/21/2016  . Constipation, chronic 09/21/2016  . Hyperlipidemia 05/27/2013  . Vitamin D deficiency 05/27/2013  . Carcinoid tumor of rectum s/p removal 2012 04/30/2011    JP serosanguinous.  Ileus that has not completely resolved.   2 Days Post-Op    LOS: 7 days   Matt B. Hassell Done, MD, [facility] [phone] beeper [phone]  09/28/2016 12:20 PM

## 2016-09-29 MED ORDER — KCL IN DEXTROSE-NACL 20-5-0.45 MEQ/L-%-% IV SOLN
INTRAVENOUS | Status: DC
  Administered 2016-09-29 – 2016-10-05 (×10): via INTRAVENOUS
  Filled 2016-09-29 (×15): qty 1000

## 2016-09-29 NOTE — Progress Notes (Signed)
  General Surgery [facility]  Assessment & Plan:  Status post lap appendectomy & laparoscopy with drain placement POD#3/8  Tolerating clear liquid diet, some distension  Persistent ileus on exam  Patient requests increased diet - will order full liquid diet  Encouraged OOB, ambulation  On IV Invanz - will check CBC in AM 12/4        Earnstine Regal, MD, [facility]       Office: [phone]    Subjective: Patient up in chair, appears uncomfortable.  Passing flatus.  Objective: Vital signs in last 24 hours: Temp:  [97.8 F (36.6 C)-98.5 F (36.9 C)] 98.5 F (36.9 C) (12/03 0659) Pulse Rate:  [74-92] 90 (12/03 0659) Resp:  [16-18] 18 (12/03 0659) BP: (149-156)/(64-75) 156/75 (12/03 0659) SpO2:  [96 %] 96 % (12/03 0659) Last BM Date: 09/21/16  Intake/Output from previous day: 12/02 0701 - 12/03 0700 In: 2620 [P.O.:1320; I.V.:1150; IV Piggyback:150] Out: 57 [Urine:3800; Drains:140] Intake/Output this shift: Total I/O In: 480 [P.O.:480] Out: 405 [Urine:400; Drains:5]  Physical Exam: HEENT - sclerae clear, mucous membranes moist Abdomen - distended, quiet on ausculatation; wounds dry and intact; JP with thin serous Ext - no edema, non-tender Neuro - alert & oriented, no focal deficits  Lab Results:   Recent Labs  09/27/16 0436  WBC 22.7*  HGB 9.4*  HCT 27.6*  PLT 438*   BMET  Recent Labs  09/27/16 0436 09/28/16 0506  NA 136  --   K 3.8  --   CL 103  --   CO2 28  --   GLUCOSE 171*  --   BUN 15  --   CREATININE 0.99 0.92  CALCIUM 7.6*  --    PT/INR No results for input(s): LABPROT, INR in the last 72 hours. Comprehensive Metabolic Panel:    Component Value Date/Time   NA 136 09/27/2016 0436   NA 134 (L) 09/26/2016 0502   K 3.8 09/27/2016 0436   K 3.5 09/26/2016 0502   CL 103 09/27/2016 0436   CL 98 (L) 09/26/2016 0502   CO2 28 09/27/2016 0436   CO2 25 09/26/2016 0502   BUN 15 09/27/2016  0436   BUN 12 09/26/2016 0502   CREATININE 0.92 09/28/2016 0506   CREATININE 0.99 09/27/2016 0436   GLUCOSE 171 (H) 09/27/2016 0436   GLUCOSE 110 (H) 09/26/2016 0502   CALCIUM 7.6 (L) 09/27/2016 0436   CALCIUM 8.3 (L) 09/26/2016 0502   AST 21 09/26/2016 0502   AST 27 09/21/2016 0925   ALT 18 09/26/2016 0502   ALT 20 09/21/2016 0925   ALKPHOS 104 09/26/2016 0502   ALKPHOS 95 09/21/2016 0925   BILITOT 1.2 09/26/2016 0502   BILITOT 0.8 09/21/2016 0925   PROT 6.1 (L) 09/26/2016 0502   PROT 7.1 09/21/2016 0925   ALBUMIN 2.8 (L) 09/26/2016 0502   ALBUMIN 4.2 09/21/2016 0925    Studies/Results: No results found.    Devin Goodwin M 09/29/2016  Patient ID: Devin Goodwin, male   DOB: 12/27/1955, 60 y.o.   MRN: [id number]

## 2016-09-30 ENCOUNTER — Inpatient Hospital Stay (HOSPITAL_COMMUNITY): Payer: BLUE CROSS/BLUE SHIELD

## 2016-09-30 LAB — CBC
HCT: 30.2 % — ABNORMAL LOW (ref 39.0–52.0)
HEMOGLOBIN: 10.2 g/dL — AB (ref 13.0–17.0)
MCH: 30.9 pg (ref 26.0–34.0)
MCHC: 33.8 g/dL (ref 30.0–36.0)
MCV: 91.5 fL (ref 78.0–100.0)
Platelets: 634 10*3/uL — ABNORMAL HIGH (ref 150–400)
RBC: 3.3 MIL/uL — AB (ref 4.22–5.81)
RDW: 14.5 % (ref 11.5–15.5)
WBC: 29.9 10*3/uL — AB (ref 4.0–10.5)

## 2016-09-30 MED ORDER — IOPAMIDOL (ISOVUE-300) INJECTION 61%
INTRAVENOUS | Status: AC
  Filled 2016-09-30: qty 100

## 2016-09-30 MED ORDER — IOPAMIDOL (ISOVUE-300) INJECTION 61%
100.0000 mL | Freq: Once | INTRAVENOUS | Status: AC | PRN
  Administered 2016-09-30: 100 mL via INTRAVENOUS

## 2016-09-30 MED ORDER — IOPAMIDOL (ISOVUE-300) INJECTION 61%
30.0000 mL | Freq: Once | INTRAVENOUS | Status: AC
  Administered 2016-09-30: 30 mL via ORAL

## 2016-09-30 MED ORDER — SODIUM CHLORIDE 0.9 % IJ SOLN
INTRAMUSCULAR | Status: AC
  Filled 2016-09-30: qty 50

## 2016-09-30 NOTE — Progress Notes (Signed)
Nutrition Follow-up  INTERVENTION:   Diet advancement per MD Upon diet advancement, Continue Boost Breeze po TID, each supplement provides 250 kcal and 9 grams of protein  RD to continue to monitor for plan  NUTRITION DIAGNOSIS:   Inadequate oral intake related to altered GI function as evidenced by NPO status.  Ongoing. NPO for CT.  GOAL:   Patient will meet greater than or equal to 90% of their needs  Not meeting.  MONITOR:   Diet advancement, Labs, Weight trends, I & O's, Need for nutrition support  ASSESSMENT:   60 y/o male s/p appendectomy 11/25, now with illeus, abdominal distention and bloating, hypoactive bowel sounds, and not passing flatus. Pt scheduled to have exploratory lap today and possibly to start TPN in new couple of days.   11/30: s/p DIAGNOSTIC LAPAROSCOPY with abdominal lavage and drain placement  Patient in room with no family at bedside. Pt reports feeling bad this AM. Pt attempted to eat some grits this morning but states he did not finish. Pt was made NPO shortly after for pending CT scan. Pt with contrast bottle at bedside. Pt has been receiving Boost Breeze supplements since on a liquid diet. Pt states "these are okay". Will continue supplements upon diet advancement.  Labs reviewed. Medications: D5 and .45% NaCl w/ KCl infusion at 75 ml/hr-provides 306 kcal  Diet Order:  Diet - low sodium heart healthy Diet full liquid Room service appropriate? Yes; Fluid consistency: Thin  Skin:  Wound (see comment) (abd incisions )  Last BM:  12/3  Height:   Ht Readings from Last 1 Encounters:  09/21/16 5\' 10"  (1.778 m)    Weight:   Wt Readings from Last 1 Encounters:  09/21/16 200 lb (90.7 kg)    Ideal Body Weight:  75.5 kg  BMI:  Body mass index is 28.7 kg/m.  Estimated Nutritional Needs:   Kcal:  2000-2200  Protein:  105-115g  Fluid:  2L/day   EDUCATION NEEDS:   No education needs identified at this time  Clayton Bibles, MS, RD,  LDN Pager: [phone] After Hours Pager: [phone]

## 2016-10-01 LAB — CBC
HCT: 30.1 % — ABNORMAL LOW (ref 39.0–52.0)
Hemoglobin: 10 g/dL — ABNORMAL LOW (ref 13.0–17.0)
MCH: 30.6 pg (ref 26.0–34.0)
MCHC: 33.2 g/dL (ref 30.0–36.0)
MCV: 92 fL (ref 78.0–100.0)
PLATELETS: 744 10*3/uL — AB (ref 150–400)
RBC: 3.27 MIL/uL — ABNORMAL LOW (ref 4.22–5.81)
RDW: 14.7 % (ref 11.5–15.5)
WBC: 26.5 10*3/uL — ABNORMAL HIGH (ref 4.0–10.5)

## 2016-10-01 LAB — AEROBIC/ANAEROBIC CULTURE (SURGICAL/DEEP WOUND)

## 2016-10-01 LAB — AEROBIC/ANAEROBIC CULTURE W GRAM STAIN (SURGICAL/DEEP WOUND)

## 2016-10-01 MED ORDER — LEVOFLOXACIN IN D5W 750 MG/150ML IV SOLN
750.0000 mg | INTRAVENOUS | Status: DC
  Administered 2016-10-01: 750 mg via INTRAVENOUS
  Filled 2016-10-01: qty 150

## 2016-10-01 MED ORDER — SODIUM CHLORIDE 0.9 % IV SOLN
1.0000 g | Freq: Three times a day (TID) | INTRAVENOUS | Status: DC
  Administered 2016-10-01 – 2016-10-08 (×21): 1 g via INTRAVENOUS
  Filled 2016-10-01 (×25): qty 1

## 2016-10-01 NOTE — Progress Notes (Signed)
[facility] Progress Note  5 Days Post-Op  Subjective: Feels improved compared to yesterday - having some right upper back pain worse with inspiration. Intermitted cough producing clear phlegm. +flatus and BMs, tolerating diet. Denies fever or chills. Ambulating.  Objective: Vital signs in last 24 hours: Temp:  [97.8 F (36.6 C)-98.5 F (36.9 C)] 98.5 F (36.9 C) (12/05 0540) Pulse Rate:  [80-82] 82 (12/05 0540) Resp:  [16] 16 (12/05 0540) BP: (132-166)/(62-75) 132/62 (12/05 0540) SpO2:  [98 %-99 %] 98 % (12/05 0540) Last BM Date: 09/30/16  Intake/Output from previous day: 12/04 0701 - 12/05 0700 In: 2000 [I.V.:1800; IV Piggyback:200] Out: 1540 [Urine:1500; Drains:40] Intake/Output this shift: No intake/output data recorded.  PE: Gen:  Alert, NAD, pleasant Pulm:  Decreased breath sounds at right lung base. Breathing unlabored. No wheezes bilaterally.  Abd: Soft, appropriately tender, mild distention, +BS,  incisions C/D/I, drain with minimal serosanguinous drainage Ext:  No erythema, edema, or tenderness   Lab Results:   Recent Labs  09/30/16 0413 10/01/16 0757  WBC 29.9* 26.5*  HGB 10.2* 10.0*  HCT 30.2* 30.1*  PLT 634* 744*   BMET No results for input(s): NA, K, CL, CO2, GLUCOSE, BUN, CREATININE, CALCIUM in the last 72 hours. PT/INR No results for input(s): LABPROT, INR in the last 72 hours. CMP     Component Value Date/Time   NA 136 09/27/2016 0436   K 3.8 09/27/2016 0436   CL 103 09/27/2016 0436   CO2 28 09/27/2016 0436   GLUCOSE 171 (H) 09/27/2016 0436   BUN 15 09/27/2016 0436   CREATININE 0.92 09/28/2016 0506   CALCIUM 7.6 (L) 09/27/2016 0436   PROT 6.1 (L) 09/26/2016 0502   ALBUMIN 2.8 (L) 09/26/2016 0502   AST 21 09/26/2016 0502   ALT 18 09/26/2016 0502   ALKPHOS 104 09/26/2016 0502   BILITOT 1.2 09/26/2016 0502   GFRNONAA >60 09/28/2016 0506   GFRAA >60 09/28/2016 0506   Lipase     Component Value Date/Time   LIPASE 30  09/21/2016 0925       Studies/Results: Ct Abdomen Pelvis W Contrast  Result Date: 09/30/2016 CLINICAL DATA:  Status post appendectomy with right-sided abdominal pain, initial encounter EXAM: CT ABDOMEN AND PELVIS WITH CONTRAST TECHNIQUE: Multidetector CT imaging of the abdomen and pelvis was performed using the standard protocol following bolus administration of intravenous contrast. CONTRAST:  160mL ISOVUE-300 IOPAMIDOL (ISOVUE-300) INJECTION 61%, 47mL ISOVUE-300 IOPAMIDOL (ISOVUE-300) INJECTION 61% COMPARISON:  09/21/2016, 09/25/2016 FINDINGS: Lower chest: Left lung base demonstrates minimal atelectatic changes. A moderate size right-sided pleural effusion with right lower lobe consolidation is noted. Hepatobiliary: Mild fatty infiltration of the liver is seen. No focal mass lesion is noted. The gallbladder is within normal limits. Pancreas: Unremarkable. No pancreatic ductal dilatation or surrounding inflammatory changes. Spleen: Normal in size without focal abnormality. Adrenals/Urinary Tract: Tiny right renal cyst is noted. No calculi or obstructive changes are seen. Stomach/Bowel: Changes consistent with the known history of appendectomy are again seen. Some mild inflammatory changes remain in the pelvis there is an air-fluid collection adjacent to the sigmoid colon best seen on image number 67 of series 2. This is somewhat bilobed in nature and measures approximately 4.0 x 2.0 cm. Some air is noted within likely related to the recent surgery. A right lower quadrant surgical drain is noted. No obstructive changes are seen. Vascular/Lymphatic: Dilatation of the infrarenal aorta is again identified and stable. Several small periaortic lymph nodes are again identified and stable. Small iliac  chain lymph nodes are also noted and stable. Reproductive: Prostate is unremarkable. Other: Small air-fluid collection in the left lower quadrant adjacent to the colon as described above. Just to the right of the  sigmoid colon on the same image there is a focal 4.2 by 1.4 cm fluid collection slightly more prominent than that seen on the prior exam. Small amount of free fluid is noted adjacent to the spleen which may be related to the recent surgery. Musculoskeletal: Bony structures are stable in appearance from the prior study. IMPRESSION: Changes consistent with prior appendectomy and recent laparoscopy with drain placement in the right lower quadrant. Two fluid collections are noted flanking the rectosigmoid region as described. Retrospect these were present on the prior exam. The air seen in the left-sided collection is new from the prior study and may be related to the recent intervention. No other significant fluid collections are noted. Mild persistent inflammatory changes in the pelvis. Moderate size right-sided pleural effusion with associated right lower lobe consolidation. The remainder the exam is stable from the prior study. Electronically Signed   By: Inez Catalina M.D.   On: 09/30/2016 15:39    Anti-infectives: Anti-infectives    Start     Dose/Rate Route Frequency Ordered Stop   09/27/16 1100  ertapenem (INVANZ) 1 g in sodium chloride 0.9 % 50 mL IVPB     1 g 100 mL/hr over 30 Minutes Intravenous Every 24 hours 09/27/16 1005     09/27/16 1100  fluconazole (DIFLUCAN) IVPB 200 mg     200 mg 100 mL/hr over 60 Minutes Intravenous Every 24 hours 09/27/16 1005     09/26/16 1645  piperacillin-tazobactam (ZOSYN) IVPB 3.375 g     3.375 g 12.5 mL/hr over 240 Minutes Intravenous  Once 09/26/16 1639 09/26/16 2045   09/26/16 1200  piperacillin-tazobactam (ZOSYN) IVPB 3.375 g  Status:  Discontinued     3.375 g 12.5 mL/hr over 240 Minutes Intravenous Every 8 hours 09/26/16 0719 09/26/16 0830   09/26/16 0845  piperacillin-tazobactam (ZOSYN) IVPB 3.375 g  Status:  Discontinued     3.375 g 12.5 mL/hr over 240 Minutes Intravenous Every 8 hours 09/26/16 0830 09/27/16 1005   09/22/16 1200   piperacillin-tazobactam (ZOSYN) IVPB 3.375 g     3.375 g 12.5 mL/hr over 240 Minutes Intravenous Every 8 hours 09/22/16 1059 09/25/16 0847   09/21/16 1400  cefoTEtan (CEFOTAN) 2 g in dextrose 5 % 50 mL IVPB     2 g 100 mL/hr over 30 Minutes Intravenous On call to O.R. 09/21/16 1246 09/21/16 1740   09/21/16 1300  cefTRIAXone (ROCEPHIN) 2 g in dextrose 5 % 50 mL IVPB  Status:  Discontinued    Comments:  Pharmacy may adjust dosing strength / duration / interval for maximal efficacy   2 g 100 mL/hr over 30 Minutes Intravenous Every 24 hours 09/21/16 1136 09/22/16 1056   09/21/16 1130  cefoTEtan (CEFOTAN) 2 g in dextrose 5 % 50 mL IVPB  Status:  Discontinued     2 g 100 mL/hr over 30 Minutes Intravenous On call to O.R. 09/21/16 1125 09/21/16 1247     Assessment/Plan Status post lap appendectomy & laparoscopy with drain placement POD#5/10   On IV Invanz - CBC 26.5 today from 29.9 yesterday   Repeat CT significant for RLL pleural effusion and consolidation. 4 x 2 cm fluid collection near sigmoid.   Productive cough and pain - was going to add Levofloxacin. discussed w/ pharmacy d/c invanz/levo and start sinlge  abx tx w/ meropenem for possible HCAP             ileus improving              continue full liquid diet             Encouraged OOB, ambulation  FEN: full liquid diet ID: Zosyn 11/25-11/30, Invanz 12/1 >>, Levofloxacin 12/5 >> VTE: Lovenox, SCD's  Dispo: concern for HCAP,  switch to meropenem. Patient clinically improving despite possible early abscess formation by the sigmoid. Discussed with Dr. Earleen Newport, collection to small for drain placement. Will follow clinically. CBC in AM. Do not advance diet today.   LOS: 10 days    Devin Goodwin , [facility] 10/01/2016, 9:10 AM Pager: [phone] Consults: [phone] Mon-Fri 7:00 am-4:30 pm Sat-Sun 7:00 am-11:30 am

## 2016-10-02 LAB — BASIC METABOLIC PANEL
Anion gap: 8 (ref 5–15)
BUN: 9 mg/dL (ref 6–20)
CALCIUM: 8.3 mg/dL — AB (ref 8.9–10.3)
CO2: 25 mmol/L (ref 22–32)
CREATININE: 0.95 mg/dL (ref 0.61–1.24)
Chloride: 104 mmol/L (ref 101–111)
GFR calc Af Amer: >60 mL/min (ref >60)
Glucose, Bld: 97 mg/dL (ref 65–99)
POTASSIUM: 3.9 mmol/L (ref 3.5–5.1)
SODIUM: 137 mmol/L (ref 135–145)

## 2016-10-02 LAB — CBC
HCT: 29.3 % — ABNORMAL LOW (ref 39.0–52.0)
HEMOGLOBIN: 9.6 g/dL — AB (ref 13.0–17.0)
MCH: 30 pg (ref 26.0–34.0)
MCHC: 32.8 g/dL (ref 30.0–36.0)
MCV: 91.6 fL (ref 78.0–100.0)
PLATELETS: 802 10*3/uL — AB (ref 150–400)
RBC: 3.2 MIL/uL — ABNORMAL LOW (ref 4.22–5.81)
RDW: 14.4 % (ref 11.5–15.5)
WBC: 26.6 10*3/uL — AB (ref 4.0–10.5)

## 2016-10-02 NOTE — Progress Notes (Signed)
[facility] Progress Note  6 Days Post-Op  Subjective: Feels he "over did it" yesterday, walked 5 laps every hour. Still feels distended and with intermittent discomfort of right ribcage/right hemiabdomen. Tolerating PO. Denies nausea or vomiting. Having flatus.  Objective: Vital signs in last 24 hours: Temp:  [97.8 F (36.6 C)-98.6 F (37 C)] 98.1 F (36.7 C) (12/06 0506) Pulse Rate:  [86-87] 87 (12/06 0506) Resp:  [16-18] 18 (12/06 0506) BP: (125-143)/(62-72) 139/62 (12/06 0506) SpO2:  [96 %-98 %] 96 % (12/06 0506) Last BM Date: 09/29/16  Intake/Output from previous day: 12/05 0701 - 12/06 0700 In: [place] [P.O.:240; I.V.:1200; IV Piggyback:400] Out: [id number] [Urine:3500; Drains:25] Intake/Output this shift: No intake/output data recorded.  PE: Gen:  Alert, NAD, pleasant Pulm:  CTA, no W/R/R Abd: Soft, appropriately tender, mild distention, hypoactive BS, incisions C/D/I, drain with minimal serosanguinous drainage Ext:  No erythema, edema, or tenderness  Lab Results:   Recent Labs  10/01/16 0757 10/02/16 0442  WBC 26.5* 26.6*  HGB 10.0* 9.6*  HCT 30.1* 29.3*  PLT 744* 802*   BMET  Recent Labs  10/02/16 0442  NA 137  K 3.9  CL 104  CO2 25  GLUCOSE 97  BUN 9  CREATININE 0.95  CALCIUM 8.3*   PT/INR No results for input(s): LABPROT, INR in the last 72 hours. CMP     Component Value Date/Time   NA 137 10/02/2016 0442   K 3.9 10/02/2016 0442   CL 104 10/02/2016 0442   CO2 25 10/02/2016 0442   GLUCOSE 97 10/02/2016 0442   BUN 9 10/02/2016 0442   CREATININE 0.95 10/02/2016 0442   CALCIUM 8.3 (L) 10/02/2016 0442   PROT 6.1 (L) 09/26/2016 0502   ALBUMIN 2.8 (L) 09/26/2016 0502   AST 21 09/26/2016 0502   ALT 18 09/26/2016 0502   ALKPHOS 104 09/26/2016 0502   BILITOT 1.2 09/26/2016 0502   GFRNONAA >60 10/02/2016 0442   GFRAA >60 10/02/2016 0442   Lipase     Component Value Date/Time   LIPASE 30 09/21/2016 0925        Studies/Results: Ct Abdomen Pelvis W Contrast  Result Date: 09/30/2016 CLINICAL DATA:  Status post appendectomy with right-sided abdominal pain, initial encounter EXAM: CT ABDOMEN AND PELVIS WITH CONTRAST TECHNIQUE: Multidetector CT imaging of the abdomen and pelvis was performed using the standard protocol following bolus administration of intravenous contrast. CONTRAST:  174mL ISOVUE-300 IOPAMIDOL (ISOVUE-300) INJECTION 61%, 17mL ISOVUE-300 IOPAMIDOL (ISOVUE-300) INJECTION 61% COMPARISON:  09/21/2016, 09/25/2016 FINDINGS: Lower chest: Left lung base demonstrates minimal atelectatic changes. A moderate size right-sided pleural effusion with right lower lobe consolidation is noted. Hepatobiliary: Mild fatty infiltration of the liver is seen. No focal mass lesion is noted. The gallbladder is within normal limits. Pancreas: Unremarkable. No pancreatic ductal dilatation or surrounding inflammatory changes. Spleen: Normal in size without focal abnormality. Adrenals/Urinary Tract: Tiny right renal cyst is noted. No calculi or obstructive changes are seen. Stomach/Bowel: Changes consistent with the known history of appendectomy are again seen. Some mild inflammatory changes remain in the pelvis there is an air-fluid collection adjacent to the sigmoid colon best seen on image number 67 of series 2. This is somewhat bilobed in nature and measures approximately 4.0 x 2.0 cm. Some air is noted within likely related to the recent surgery. A right lower quadrant surgical drain is noted. No obstructive changes are seen. Vascular/Lymphatic: Dilatation of the infrarenal aorta is again identified and stable. Several small periaortic lymph nodes are again identified  and stable. Small iliac chain lymph nodes are also noted and stable. Reproductive: Prostate is unremarkable. Other: Small air-fluid collection in the left lower quadrant adjacent to the colon as described above. Just to the right of the sigmoid colon on  the same image there is a focal 4.2 by 1.4 cm fluid collection slightly more prominent than that seen on the prior exam. Small amount of free fluid is noted adjacent to the spleen which may be related to the recent surgery. Musculoskeletal: Bony structures are stable in appearance from the prior study. IMPRESSION: Changes consistent with prior appendectomy and recent laparoscopy with drain placement in the right lower quadrant. Two fluid collections are noted flanking the rectosigmoid region as described. Retrospect these were present on the prior exam. The air seen in the left-sided collection is new from the prior study and may be related to the recent intervention. No other significant fluid collections are noted. Mild persistent inflammatory changes in the pelvis. Moderate size right-sided pleural effusion with associated right lower lobe consolidation. The remainder the exam is stable from the prior study. Electronically Signed   By: Inez Catalina M.D.   On: 09/30/2016 15:39    Anti-infectives: Anti-infectives    Start     Dose/Rate Route Frequency Ordered Stop   10/01/16 1400  meropenem (MERREM) 1 g in sodium chloride 0.9 % 100 mL IVPB     1 g 200 mL/hr over 30 Minutes Intravenous Every 8 hours 10/01/16 1105     10/01/16 1000  levofloxacin (LEVAQUIN) IVPB 750 mg  Status:  Discontinued     750 mg 100 mL/hr over 90 Minutes Intravenous Every 24 hours 10/01/16 0920 10/01/16 1104   09/27/16 1100  ertapenem (INVANZ) 1 g in sodium chloride 0.9 % 50 mL IVPB  Status:  Discontinued     1 g 100 mL/hr over 30 Minutes Intravenous Every 24 hours 09/27/16 1005 10/01/16 1104   09/27/16 1100  fluconazole (DIFLUCAN) IVPB 200 mg     200 mg 100 mL/hr over 60 Minutes Intravenous Every 24 hours 09/27/16 1005     09/26/16 1645  piperacillin-tazobactam (ZOSYN) IVPB 3.375 g     3.375 g 12.5 mL/hr over 240 Minutes Intravenous  Once 09/26/16 1639 09/26/16 2045   09/26/16 1200  piperacillin-tazobactam (ZOSYN) IVPB  3.375 g  Status:  Discontinued     3.375 g 12.5 mL/hr over 240 Minutes Intravenous Every 8 hours 09/26/16 0719 09/26/16 0830   09/26/16 0845  piperacillin-tazobactam (ZOSYN) IVPB 3.375 g  Status:  Discontinued     3.375 g 12.5 mL/hr over 240 Minutes Intravenous Every 8 hours 09/26/16 0830 09/27/16 1005   09/22/16 1200  piperacillin-tazobactam (ZOSYN) IVPB 3.375 g     3.375 g 12.5 mL/hr over 240 Minutes Intravenous Every 8 hours 09/22/16 1059 09/25/16 0847   09/21/16 1400  cefoTEtan (CEFOTAN) 2 g in dextrose 5 % 50 mL IVPB     2 g 100 mL/hr over 30 Minutes Intravenous On call to O.R. 09/21/16 1246 09/21/16 1740   09/21/16 1300  cefTRIAXone (ROCEPHIN) 2 g in dextrose 5 % 50 mL IVPB  Status:  Discontinued    Comments:  Pharmacy may adjust dosing strength / duration / interval for maximal efficacy   2 g 100 mL/hr over 30 Minutes Intravenous Every 24 hours 09/21/16 1136 09/22/16 1056   09/21/16 1130  cefoTEtan (CEFOTAN) 2 g in dextrose 5 % 50 mL IVPB  Status:  Discontinued     2 g 100 mL/hr over  30 Minutes Intravenous On call to O.R. 09/21/16 1125 09/21/16 1247     Assessment/Plan Status post lap appendectomy & laparoscopy with drain placement POD#5/10              CBC 26 again today             CT 12/4 significant for RLL pleural effusion and consolidation. 4 x 2 cm fluid collection near sigmoid.  ileus improving  continue full liquid diet Encouraged OOB, ambulation  HCAP - meropenem   FEN: full liquid diet ID: Zosyn 11/25-11/30, Invanz 12/1-12/5, meropenem 12/5>> VTE: Lovenox, SCD's  Plan: d/c JP drain, CBC in AM May need repeat CT scan in the next couple of days to re-evaluate for abscess formation if WBC remains elevated and abdominal discomfort and distention persist.     LOS: 11 days    Jill Alexanders , [facility] 10/02/2016, 9:28 AM Pager: [phone] Consults: [phone] Mon-Fri 7:00 am-4:30 pm Sat-Sun  7:00 am-11:30 am

## 2016-10-03 ENCOUNTER — Inpatient Hospital Stay (HOSPITAL_COMMUNITY): Payer: BLUE CROSS/BLUE SHIELD

## 2016-10-03 LAB — CBC
HCT: 30.2 % — ABNORMAL LOW (ref 39.0–52.0)
Hemoglobin: 9.9 g/dL — ABNORMAL LOW (ref 13.0–17.0)
MCH: 30.3 pg (ref 26.0–34.0)
MCHC: 32.8 g/dL (ref 30.0–36.0)
MCV: 92.4 fL (ref 78.0–100.0)
Platelets: 869 10*3/uL — ABNORMAL HIGH (ref 150–400)
RBC: 3.27 MIL/uL — ABNORMAL LOW (ref 4.22–5.81)
RDW: 14.5 % (ref 11.5–15.5)
WBC: 29.5 10*3/uL — ABNORMAL HIGH (ref 4.0–10.5)

## 2016-10-03 MED ORDER — SODIUM CHLORIDE 0.9 % IJ SOLN
INTRAMUSCULAR | Status: AC
  Filled 2016-10-03: qty 50

## 2016-10-03 MED ORDER — IOPAMIDOL (ISOVUE-300) INJECTION 61%
INTRAVENOUS | Status: AC
  Filled 2016-10-03: qty 100

## 2016-10-03 MED ORDER — SODIUM CHLORIDE 0.9 % IV SOLN
INTRAVENOUS | Status: AC
  Filled 2016-10-03: qty 250

## 2016-10-03 MED ORDER — IOPAMIDOL (ISOVUE-300) INJECTION 61%
INTRAVENOUS | Status: AC
  Filled 2016-10-03: qty 30

## 2016-10-03 MED ORDER — IOPAMIDOL (ISOVUE-300) INJECTION 61%: 30.0000 mL | Freq: Once | INTRAVENOUS | Status: DC

## 2016-10-03 MED ORDER — IOPAMIDOL (ISOVUE-300) INJECTION 61%
100.0000 mL | Freq: Once | INTRAVENOUS | Status: AC | PRN
  Administered 2016-10-03: 100 mL via INTRAVENOUS

## 2016-10-03 NOTE — Progress Notes (Signed)
Verbal order given by PA Simaan that patient may ambulate off unit with staff to get fresh air Neta Mends RN 7:07 PM 10-03-2016

## 2016-10-03 NOTE — Progress Notes (Signed)
Pt's status changed to NPO; but IV medication causes hallucinations to pt and he would like to stick with his oral meds. Paged MD on call to see about adding sips with meds. MD agreed since there is no scheduled procedure, he can have sips with meds.  Will continue to monitor.  Roselind Rily

## 2016-10-03 NOTE — Progress Notes (Signed)
Patient is requesting to have oral pain meds and he is NPO, MD on call paged per patient's request, awaiting callback Neta Mends RN 7:05 PM 10-03-2016

## 2016-10-03 NOTE — Progress Notes (Signed)
Nutrition Follow-up  INTERVENTION:   Recommend new weight be obtained (Last recorded 11/25).  Diet advancement per MD Upon diet advancement, Continue Boost Breeze po TID, each supplement provides 250 kcal and 9 grams of protein  RD to continue to monitor for plan  NUTRITION DIAGNOSIS:   Inadequate oral intake related to altered GI function as evidenced by NPO status.  Ongoing.  GOAL:   Patient will meet greater than or equal to 90% of their needs  Not meeting.  MONITOR:   Diet advancement, Labs, Weight trends, I & O's  ASSESSMENT:   60 y/o male s/p appendectomy 11/25, now with illeus, abdominal distention and bloating, hypoactive bowel sounds, and not passing flatus. Pt scheduled to have exploratory lap today and possibly to start TPN in new couple of days.    Until today pt has been on a full liquid diet, consuming 100% of his meal trays and drinking most Boost Breeze supplements provided. Currently NPO for repeat CT of abdomen. Pt c/o abdominal pain.  Will continue to monitor for plan and diet advancement.  Labs reviewed. Medications: D5 and .45% NaCl w/ KCl infusion at 100 ml/hr -provides 408 kcal   Diet Order:  Diet - low sodium heart healthy Diet NPO time specified  Skin:  Wound (see comment) (abd incisions )  Last BM:  12/3  Height:   Ht Readings from Last 1 Encounters:  09/21/16 5\' 10"  (1.778 m)    Weight:   Wt Readings from Last 1 Encounters:  09/21/16 200 lb (90.7 kg)    Ideal Body Weight:  75.5 kg  BMI:  Body mass index is 28.7 kg/m.  Estimated Nutritional Needs:   Kcal:  2000-2200  Protein:  105-115g  Fluid:  2L/day   EDUCATION NEEDS:   No education needs identified at this time  Clayton Bibles, MS, RD, LDN Pager: [phone] After Hours Pager: [phone]

## 2016-10-03 NOTE — Progress Notes (Signed)
[facility] Progress Note  7 Days Post-Op  Subjective: Mild intermittent abdominal pain - patient feels it is worse when he has to urinate and decreases after he empties his bladder. Tolerating full liquids. Having loose bowel movement and passing flatus. Ambulating. Pulling 1500 cc on IS. Denies fever, chills, chest pain, SOB, dysuria, urinary frequency, leg swelling, nausea or vomiting.  Objective: Vital signs in last 24 hours: Temp:  [97.6 F (36.4 C)-98.7 F (37.1 C)] 97.6 F (36.4 C) (12/07 0515) Pulse Rate:  [73-91] 73 (12/07 0515) Resp:  [16-18] 18 (12/07 0515) BP: (122-147)/(58-73) 136/61 (12/07 0515) SpO2:  [96 %-97 %] 96 % (12/07 0515) Last BM Date: 09/29/16  Intake/Output from previous day: 12/06 0701 - 12/07 0700 In: 4610 [P.O.:1610; I.V.:2400; IV Piggyback:600] Out: 2360 [Urine:2360] Intake/Output this shift: No intake/output data recorded.  PE: Gen:  Alert, NAD, pleasant HEENT: mucous membranes moist. Left lateral incisor and canine teeth with poor dentition/necrosis at base - no erythema or abscess appreciated. Card:  RRR Pulm:  unlabored, CTA, no W/R/R Abd: Soft, non-tender, distended and tympanic, +BS, incisions C/D/I, surgical drain site c/d/i, small ecchymosis from lovenox injections Ext:  No erythema, edema, or tenderness; no pitting edema  Lab Results:   Recent Labs  10/02/16 0442 10/03/16 0429  WBC 26.6* 29.5*  HGB 9.6* 9.9*  HCT 29.3* 30.2*  PLT 802* 869*   BMET  Recent Labs  10/02/16 0442  NA 137  K 3.9  CL 104  CO2 25  GLUCOSE 97  BUN 9  CREATININE 0.95  CALCIUM 8.3*   PT/INR No results for input(s): LABPROT, INR in the last 72 hours. CMP     Component Value Date/Time   NA 137 10/02/2016 0442   K 3.9 10/02/2016 0442   CL 104 10/02/2016 0442   CO2 25 10/02/2016 0442   GLUCOSE 97 10/02/2016 0442   BUN 9 10/02/2016 0442   CREATININE 0.95 10/02/2016 0442   CALCIUM 8.3 (L) 10/02/2016 0442   PROT 6.1 (L)  09/26/2016 0502   ALBUMIN 2.8 (L) 09/26/2016 0502   AST 21 09/26/2016 0502   ALT 18 09/26/2016 0502   ALKPHOS 104 09/26/2016 0502   BILITOT 1.2 09/26/2016 0502   GFRNONAA >60 10/02/2016 0442   GFRAA >60 10/02/2016 0442   Lipase     Component Value Date/Time   LIPASE 30 09/21/2016 0925       Studies/Results: No results found.  Anti-infectives: Anti-infectives    Start     Dose/Rate Route Frequency Ordered Stop   10/01/16 1400  meropenem (MERREM) 1 g in sodium chloride 0.9 % 100 mL IVPB     1 g 200 mL/hr over 30 Minutes Intravenous Every 8 hours 10/01/16 1105     10/01/16 1000  levofloxacin (LEVAQUIN) IVPB 750 mg  Status:  Discontinued     750 mg 100 mL/hr over 90 Minutes Intravenous Every 24 hours 10/01/16 0920 10/01/16 1104   09/27/16 1100  ertapenem (INVANZ) 1 g in sodium chloride 0.9 % 50 mL IVPB  Status:  Discontinued     1 g 100 mL/hr over 30 Minutes Intravenous Every 24 hours 09/27/16 1005 10/01/16 1104   09/27/16 1100  fluconazole (DIFLUCAN) IVPB 200 mg     200 mg 100 mL/hr over 60 Minutes Intravenous Every 24 hours 09/27/16 1005     09/26/16 1645  piperacillin-tazobactam (ZOSYN) IVPB 3.375 g     3.375 g 12.5 mL/hr over 240 Minutes Intravenous  Once 09/26/16 1639 09/26/16 2045  09/26/16 1200  piperacillin-tazobactam (ZOSYN) IVPB 3.375 g  Status:  Discontinued     3.375 g 12.5 mL/hr over 240 Minutes Intravenous Every 8 hours 09/26/16 0719 09/26/16 0830   09/26/16 0845  piperacillin-tazobactam (ZOSYN) IVPB 3.375 g  Status:  Discontinued     3.375 g 12.5 mL/hr over 240 Minutes Intravenous Every 8 hours 09/26/16 0830 09/27/16 1005   09/22/16 1200  piperacillin-tazobactam (ZOSYN) IVPB 3.375 g     3.375 g 12.5 mL/hr over 240 Minutes Intravenous Every 8 hours 09/22/16 1059 09/25/16 0847   09/21/16 1400  cefoTEtan (CEFOTAN) 2 g in dextrose 5 % 50 mL IVPB     2 g 100 mL/hr over 30 Minutes Intravenous On call to O.R. 09/21/16 1246 09/21/16 1740   09/21/16 1300   cefTRIAXone (ROCEPHIN) 2 g in dextrose 5 % 50 mL IVPB  Status:  Discontinued    Comments:  Pharmacy may adjust dosing strength / duration / interval for maximal efficacy   2 g 100 mL/hr over 30 Minutes Intravenous Every 24 hours 09/21/16 1136 09/22/16 1056   09/21/16 1130  cefoTEtan (CEFOTAN) 2 g in dextrose 5 % 50 mL IVPB  Status:  Discontinued     2 g 100 mL/hr over 30 Minutes Intravenous On call to O.R. 09/21/16 1125 09/21/16 1247     Assessment/Plan Status post lap appendectomy & laparoscopy with drain placement POD#6/11  worsening leukocytosis today - WBC 29.5 CT 12/4 significant for RLL pleural effusion and consolidation. 4 x 2 cm fluid collection near sigmoid.  JP drain removed 12/6 Encouraged OOB, ambulation  IS/pulm toilet  HCAP - meropenem   FEN: full liquid diet ID: Zosyn 11/25-11/30, Invanz 12/1-12/5, meropenem 12/5>> VTE: Lovenox, SCD's  Plan: worsening leukocytosis, with concern for post-operative intra-abdominal abscess. NPO, hold anticoagulation, and repeat CT Abd/Pelvis w/ Cm.    LOS: 12 days    Jill Alexanders , [facility] 10/03/2016, 9:32 AM Pager: [phone] Consults: [phone] Mon-Fri 7:00 am-4:30 pm Sat-Sun 7:00 am-11:30 am

## 2016-10-04 DIAGNOSIS — K3580 Unspecified acute appendicitis: Secondary | ICD-10-CM

## 2016-10-04 DIAGNOSIS — D72828 Other elevated white blood cell count: Secondary | ICD-10-CM

## 2016-10-04 DIAGNOSIS — K651 Peritoneal abscess: Secondary | ICD-10-CM

## 2016-10-04 LAB — CBC
HEMATOCRIT: 28.3 % — AB (ref 39.0–52.0)
Hemoglobin: 9.2 g/dL — ABNORMAL LOW (ref 13.0–17.0)
MCH: 30 pg (ref 26.0–34.0)
MCHC: 32.5 g/dL (ref 30.0–36.0)
MCV: 92.2 fL (ref 78.0–100.0)
Platelets: 830 10*3/uL — ABNORMAL HIGH (ref 150–400)
RBC: 3.07 MIL/uL — ABNORMAL LOW (ref 4.22–5.81)
RDW: 14.3 % (ref 11.5–15.5)
WBC: 22.1 10*3/uL — AB (ref 4.0–10.5)

## 2016-10-04 MED ORDER — ENOXAPARIN SODIUM 40 MG/0.4ML ~~LOC~~ SOLN
40.0000 mg | SUBCUTANEOUS | Status: DC
  Administered 2016-10-04 – 2016-10-06 (×3): 40 mg via SUBCUTANEOUS
  Filled 2016-10-04 (×5): qty 0.4

## 2016-10-04 NOTE — Progress Notes (Signed)
[facility] Progress Note  8 Days Post-Op  Subjective: Frustrated about prolonged hospital course. Still with intermittent lower abdominal pain. Ambulating and having loose bowel movements. Denies nausea or vomiting.   Objective: Vital signs in last 24 hours: Temp:  [99 F (37.2 C)-99.1 F (37.3 C)] 99 F (37.2 C) (12/08 0544) Pulse Rate:  [83-91] 83 (12/08 0544) Resp:  [18] 18 (12/08 0544) BP: (128-151)/(59-65) 128/62 (12/08 0544) SpO2:  [95 %-99 %] 95 % (12/08 0544) Last BM Date: 10/03/16  Intake/Output from previous day: 12/07 0701 - 12/08 0700 In: 480 [P.O.:480] Out: 4250 [Urine:4250] Intake/Output this shift: No intake/output data recorded.  PE: Gen:  Alert, NAD, cooperative Pulm:  CTA, no W/R/R Abd: Soft, TTP lower abdomen with guarding, mild distention, +BS, incisions C/D/I Ext:  No erythema, edema, or tenderness   Lab Results:   Recent Labs  10/03/16 0429 10/04/16 0824  WBC 29.5* 22.1*  HGB 9.9* 9.2*  HCT 30.2* 28.3*  PLT 869* 830*   BMET  Recent Labs  10/02/16 0442  NA 137  K 3.9  CL 104  CO2 25  GLUCOSE 97  BUN 9  CREATININE 0.95  CALCIUM 8.3*   PT/INR No results for input(s): LABPROT, INR in the last 72 hours. CMP     Component Value Date/Time   NA 137 10/02/2016 0442   K 3.9 10/02/2016 0442   CL 104 10/02/2016 0442   CO2 25 10/02/2016 0442   GLUCOSE 97 10/02/2016 0442   BUN 9 10/02/2016 0442   CREATININE 0.95 10/02/2016 0442   CALCIUM 8.3 (L) 10/02/2016 0442   PROT 6.1 (L) 09/26/2016 0502   ALBUMIN 2.8 (L) 09/26/2016 0502   AST 21 09/26/2016 0502   ALT 18 09/26/2016 0502   ALKPHOS 104 09/26/2016 0502   BILITOT 1.2 09/26/2016 0502   GFRNONAA >60 10/02/2016 0442   GFRAA >60 10/02/2016 0442   Lipase     Component Value Date/Time   LIPASE 30 09/21/2016 0925   Studies/Results: Ct Abdomen Pelvis W Contrast  Result Date: 10/03/2016 CLINICAL DATA:  Intermittent abdominal pain, loose bowel movements EXAM: CT  ABDOMEN AND PELVIS WITH CONTRAST TECHNIQUE: Multidetector CT imaging of the abdomen and pelvis was performed using the standard protocol following bolus administration of intravenous contrast. CONTRAST:  162mL ISOVUE-300 IOPAMIDOL (ISOVUE-300) INJECTION 61% COMPARISON:  CT abdomen pelvis of 09/30/2016 FINDINGS: Lower chest: There is persistent right pleural effusion present with partial atelectasis of the right lower lobe. The left lung base is clear other than mild linear atelectasis. Hepatobiliary: The liver enhances with no focal abnormality and no ductal dilatation is seen. There is a small collection of fluid along the tip of the right lobe of liver posteriorly measuring 16 mm in diameter which was present previously and may represent a small abdominal abscess perihepatic in location. No calcified gallstones are seen. Pancreas: The pancreas is normal in size and the pancreatic duct is not dilated. Spleen: The spleen is unremarkable. Adrenals/Urinary Tract: The adrenal glands appear symmetrical and normal. The kidneys enhance with no calculus or mass and there is no evidence of hydronephrosis. The ureters appear normal in caliber. The urinary bladder is unremarkable being moderately distended with urine. Stomach/Bowel: The stomach is moderately distended with oral contrast and air. No small bowel distention is seen. The colon is largely decompressed and there is oral contrast throughout the nondistended colon. There are rectosigmoid colon is very elongated and redundant extending upright of the pelvis. Adjacent to the rectosigmoid colon within the  pelvic lower pelvis there are several fluid collections present with some air within these collections. I have measured approximately 5 of these collections within the pelvis most consistent with abscesses, the largest of 2.9 x 1.9 cm. Some of these collections were present previously, but others have formed into more discrete collections currently. Vascular/Lymphatic:  The abdominal aorta is normal in caliber with mild to moderate abdominal aortic atherosclerosis present. The distal abdominal aorta measures 3.4 cm in diameter consistent with a small distal abdominal aortic aneurysm. Reproductive: The prostate is normal in size. There is a small amount of free air within the peritoneal cavity some of which could be due to the recent surgical intervention although perforated bowel cannot be excluded. Other: None Musculoskeletal: The lumbar vertebrae are in normal alignment with normal intervertebral disc spaces. IMPRESSION: 1. At least 6 small fluid collections, five of which are within the pelvis most consistent with abscesses containing air bubbles as well. 2. Small amount of free intraperitoneal air may be postoperative, but perforated bowel cannot be excluded. 3. No change in moderate size right pleural effusion with right basilar atelectasis. 4. Fusiform dilatation of the distal abdominal aorta up to 3.4 cm consistent with aneurysm. Electronically Signed   By: Ivar Drape M.D.   On: 10/03/2016 16:53    Anti-infectives: Anti-infectives    Start     Dose/Rate Route Frequency Ordered Stop   10/01/16 1400  meropenem (MERREM) 1 g in sodium chloride 0.9 % 100 mL IVPB     1 g 200 mL/hr over 30 Minutes Intravenous Every 8 hours 10/01/16 1105     10/01/16 1000  levofloxacin (LEVAQUIN) IVPB 750 mg  Status:  Discontinued     750 mg 100 mL/hr over 90 Minutes Intravenous Every 24 hours 10/01/16 0920 10/01/16 1104   09/27/16 1100  ertapenem (INVANZ) 1 g in sodium chloride 0.9 % 50 mL IVPB  Status:  Discontinued     1 g 100 mL/hr over 30 Minutes Intravenous Every 24 hours 09/27/16 1005 10/01/16 1104   09/27/16 1100  fluconazole (DIFLUCAN) IVPB 200 mg     200 mg 100 mL/hr over 60 Minutes Intravenous Every 24 hours 09/27/16 1005     09/26/16 1645  piperacillin-tazobactam (ZOSYN) IVPB 3.375 g     3.375 g 12.5 mL/hr over 240 Minutes Intravenous  Once 09/26/16 1639 09/26/16  2045   09/26/16 1200  piperacillin-tazobactam (ZOSYN) IVPB 3.375 g  Status:  Discontinued     3.375 g 12.5 mL/hr over 240 Minutes Intravenous Every 8 hours 09/26/16 0719 09/26/16 0830   09/26/16 0845  piperacillin-tazobactam (ZOSYN) IVPB 3.375 g  Status:  Discontinued     3.375 g 12.5 mL/hr over 240 Minutes Intravenous Every 8 hours 09/26/16 0830 09/27/16 1005   09/22/16 1200  piperacillin-tazobactam (ZOSYN) IVPB 3.375 g     3.375 g 12.5 mL/hr over 240 Minutes Intravenous Every 8 hours 09/22/16 1059 09/25/16 0847   09/21/16 1400  cefoTEtan (CEFOTAN) 2 g in dextrose 5 % 50 mL IVPB     2 g 100 mL/hr over 30 Minutes Intravenous On call to O.R. 09/21/16 1246 09/21/16 1740   09/21/16 1300  cefTRIAXone (ROCEPHIN) 2 g in dextrose 5 % 50 mL IVPB  Status:  Discontinued    Comments:  Pharmacy may adjust dosing strength / duration / interval for maximal efficacy   2 g 100 mL/hr over 30 Minutes Intravenous Every 24 hours 09/21/16 1136 09/22/16 1056   09/21/16 1130  cefoTEtan (CEFOTAN) 2 g in  dextrose 5 % 50 mL IVPB  Status:  Discontinued     2 g 100 mL/hr over 30 Minutes Intravenous On call to O.R. 09/21/16 1125 09/21/16 1247     Assessment/Plan Status post lap appendectomy & laparoscopy with drain placement POD#6/11 - WBC 22.1 from 29.5 yesterday - repeat CT 12/4 significant for RLL pleural effusion and consolidation. 4 x 2 cm fluid collection near sigmoid - not amenable to IR drainage - IV abx and follow  - JP drain removed 12/6 - Repeat CT 12/7 significant for multiple small fluid collections in the pelvis and persistent R pleural effusion - Encouraged OOB, ambulation - IS/pulm toilet  HCAP - meropenem   FEN: full liquid diet ID: Zosyn 11/25-11/30, Invanz 12/1-12/5, meropenem 12/5>> VTE: Lovenox, SCD's  Plan: improving leukocytosis, NPO, IR consult for perc drainage/cultures. Continue IV abx.  IR consulted - no good window for drainage; allow clears, re-start anticoagulation.  WBC  trending down - CBC in AM. If WBC increases or abdominal pain worsens will add IV antifungal.   LOS: 13 days    Jill Alexanders , [facility] 10/04/2016, 8:59 AM Pager: [phone] Consults: [phone] Mon-Fri 7:00 am-4:30 pm Sat-Sun 7:00 am-11:30 am

## 2016-10-04 NOTE — Consult Note (Signed)
[facility]       Reason for Consult: leukocytosis    Referring Physician: Dr. Hassell Done  Principal Problem:   Acute appendicitis s/p lap appendectomy 09/21/2016 Active Problems:   AAA (abdominal aortic aneurysm)    Essential hypertension   Carcinoid tumor of rectum s/p removal 2012   Hyperlipidemia   Tobacco abuse   Constipation, chronic   Inguinal hernia, right   Heartburn   . amLODipine  5 mg Oral Daily  . enoxaparin (LOVENOX) injection  40 mg Subcutaneous Q24H  . famotidine (PEPCID) IV  20 mg Intravenous Q12H  . feeding supplement  1 Container Oral TID BM  . fluconazole (DIFLUCAN) IV  200 mg Intravenous Q24H  . iopamidol  30 mL Oral Once  . lip balm  1 application Topical BID  . losartan  50 mg Oral Daily  . meropenem (MERREM) IV  1 g Intravenous Q8H  . nicotine  21 mg Transdermal Daily    Recommendations: Continue meropenem and fluconazole   Assessment: He has small fluid collections post op concerning for abscess with air bubbles, leukocytosis.  I am concerned that it won't improve with antibiotics but reasonable to monitor WBC, clinically.  If no improvement, will need to consider drainage.  On meropenem and fluconazole he is covered well and broadly.    Dr. Megan Salon to follow up tomorrow  Antibiotics: Meropenem and fluconazole  HPI: Devin Goodwin is a 60 y.o. male with recent appendectomy 11/25 and prolonged hospitalization since with persistent pain in abdomen and CT scan with small fluid collections concerning for abscesses.  She has been on broad sectrum antibiotics and wBC increased to 29,000 and he was broadened to meropenem, fluconazole. WBC down to 22 today but persistent pain and CT findings. He has not noted fever or chills.  Is hungry.  IR evaluated patient and does not feel abscesses are drainable due to small size.  Also concern for pneumonia on CT but no sob, not hypoxic.    Review of Systems:  Constitutional:  negative for fevers and chills Respiratory: negative for cough or sputum Gastrointestinal: positive for nausea and diarrhea, negative for abdominal pain All other systems reviewed and are negative    Past Medical History:  Diagnosis Date  . AAA (abdominal aortic aneurysm)  09/21/2016   3.3cm on CT scan 09/21/2016  . 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 Difficult intubation 09/21/2016  . Hyperlipidemia 05/27/2013  . Hypertension   . Vitamin D deficiency 05/27/2013   Overview:  10/1 IMO update    Social History  Substance Use Topics  . Smoking status: Current Every Day Smoker    Packs/day: 0.50    Years: 40.00    Types: Cigarettes  . Smokeless tobacco: Never Used  . Alcohol use No    History reviewed. No pertinent family history.  Allergies  Allergen Reactions  . Lisinopril Cough  . Simvastatin Other (See Comments)    myalgia    Physical Exam: Constitutional: in no apparent distress and alert  Vitals:   10/04/16 0544 10/04/16 1400  BP: 128/62 135/66  Pulse: 83 83  Resp: 18 18  Temp: 99 F (37.2 C) 98.9 F (37.2 C)   EYES: anicteric ENMT:no thrush Cardiovascular: Cor RRR Respiratory: CTA B; normal respiratory effort GI: Bowel sounds are normal; some distention, soft, non tender Musculoskeletal: no pedal edema noted Skin: negatives: no rash Neuro: non-focal  Lab Results  Component Value Date   WBC 22.1 (H) 10/04/2016   HGB 9.2 (L) 10/04/2016   HCT 28.3 (L) 10/04/2016   MCV 92.2 10/04/2016   PLT 830 (H) 10/04/2016    Lab Results  Component Value Date   CREATININE 0.95 10/02/2016   BUN 9 10/02/2016   NA 137 10/02/2016   K 3.9 10/02/2016   CL 104 10/02/2016   CO2 25 10/02/2016    Lab Results  Component Value Date   ALT 18 09/26/2016   AST 21 09/26/2016   ALKPHOS 104 09/26/2016     Microbiology: Recent Results (from the past 240 hour(s))  Surgical pcr screen     Status: Abnormal     Collection Time: 09/26/16 11:35 AM  Result Value Ref Range Status   MRSA, PCR INVALID RESULTS, SPECIMEN SENT FOR CULTURE (A) NEGATIVE Final    Comment: RESULT CALLED TO, READ BACK BY AND VERIFIED WITH: AFTERHOURS AT 1600 ON 09/26/16 BY S.VANHOORNE    Staphylococcus aureus INVALID RESULTS, SPECIMEN SENT FOR CULTURE (A) NEGATIVE Final    Comment: RESULT CALLED TO, READ BACK BY AND VERIFIED WITH: AFTERHOURS AT 1600 ON 09/26/16 BY S.VANHOORNE        The Xpert SA Assay (FDA approved for NASAL specimens in patients over 33 years of age), is one component of a comprehensive surveillance program.  Test performance has been validated by [facility] for patients greater than or equal to 64 year old. It is not intended to diagnose infection nor to guide or monitor treatment.   MRSA culture     Status: None   Collection Time: 09/26/16 11:35 AM  Result Value Ref Range Status   Specimen Description NASAL SWAB  Final   Special Requests CONFIRM INVALID MRSA PCR RESULT  Final   Culture   Final    NO MRSA DETECTED Performed at [facility]    Report Status 09/28/2016 FINAL  Final  Aerobic/Anaerobic Culture (surgical/deep wound)     Status: None   Collection Time: 09/26/16  1:55 PM  Result Value Ref Range Status   Specimen Description TISSUE PERITONEAL  Final   Special Requests NONE  Final   Gram Stain   Final    FEW WBC PRESENT,BOTH PMN AND MONONUCLEAR NO ORGANISMS SEEN Gram Stain Report Called to,Read Back By and Verified With: HERRSHAFT,M @ [id number] ON PD:[id number] BY POTEAT,S    Culture   Final    RARE BACILLUS SPECIES CRITICAL RESULT CALLED TO, READ BACK BY AND VERIFIED WITH: D TORRES 09/29/16 @ [place] Performed at [facility]    Report Status 10/01/2016 FINAL  Final    Scharlene Gloss, [email or web address] [email or web address] pager  [phone] cell 10/04/2016, 4:14 PM

## 2016-10-04 NOTE — Progress Notes (Signed)
Brief progress:  I consult ID, Dr. Linus Salmons, for recommendations regarding antibiotic treatment, as patient has been on meropenem and diflucan with increasing white count and no good window for drainage by IR. Appreciate Dr. Linus Salmons evaluating.  Obie Dredge, PA-C [facility] Pager: [phone] Consults: [phone] Mon-Fri 7:00 am-4:30 pm Sat-Sun 7:00 am-11:30 am

## 2016-10-05 DIAGNOSIS — R12 Heartburn: Secondary | ICD-10-CM

## 2016-10-05 DIAGNOSIS — Z888 Allergy status to other drugs, medicaments and biological substances status: Secondary | ICD-10-CM

## 2016-10-05 DIAGNOSIS — I10 Essential (primary) hypertension: Secondary | ICD-10-CM

## 2016-10-05 DIAGNOSIS — K3589 Other acute appendicitis: Secondary | ICD-10-CM

## 2016-10-05 DIAGNOSIS — K4090 Unilateral inguinal hernia, without obstruction or gangrene, not specified as recurrent: Secondary | ICD-10-CM

## 2016-10-05 DIAGNOSIS — Z79899 Other long term (current) drug therapy: Secondary | ICD-10-CM

## 2016-10-05 DIAGNOSIS — E785 Hyperlipidemia, unspecified: Secondary | ICD-10-CM

## 2016-10-05 DIAGNOSIS — K5900 Constipation, unspecified: Secondary | ICD-10-CM

## 2016-10-05 DIAGNOSIS — F17210 Nicotine dependence, cigarettes, uncomplicated: Secondary | ICD-10-CM

## 2016-10-05 DIAGNOSIS — I714 Abdominal aortic aneurysm, without rupture: Secondary | ICD-10-CM

## 2016-10-05 LAB — CBC
HCT: 28 % — ABNORMAL LOW (ref 39.0–52.0)
Hemoglobin: 9.1 g/dL — ABNORMAL LOW (ref 13.0–17.0)
MCH: 30 pg (ref 26.0–34.0)
MCHC: 32.5 g/dL (ref 30.0–36.0)
MCV: 92.4 fL (ref 78.0–100.0)
PLATELETS: 865 10*3/uL — AB (ref 150–400)
RBC: 3.03 MIL/uL — AB (ref 4.22–5.81)
RDW: 14.4 % (ref 11.5–15.5)
WBC: 18.6 10*3/uL — AB (ref 4.0–10.5)

## 2016-10-05 NOTE — Progress Notes (Signed)
[facility] Progress Note  9 Days Post-Op  Subjective: Feeling better. Ambulating and having loose bowel movements. Denies nausea or vomiting.  Tolerating FLD  Objective: Vital signs in last 24 hours: Temp:  [98.9 F (37.2 C)-99.5 F (37.5 C)] 99 F (37.2 C) (12/09 0545) Pulse Rate:  [61-89] 61 (12/09 1001) Resp:  [18] 18 (12/09 0545) BP: (128-141)/(63-77) 128/77 (12/09 1001) SpO2:  [95 %-98 %] 98 % (12/09 0545) Last BM Date: 10/03/16  Intake/Output from previous day: 12/08 0701 - 12/09 0700 In: 3710 [P.O.:360; I.V.:2400; IV Piggyback:950] Out: 2390 [Urine:2390] Intake/Output this shift: Total I/O In: -  Out: 600 [Urine:600]  PE: Gen:  Alert, NAD, cooperative Abd: Soft, Mild TTP lower abdomen. incisions C/D/I Ext:  No erythema, edema, or tenderness   Lab Results:   Recent Labs  10/04/16 0824 10/05/16 0501  WBC 22.1* 18.6*  HGB 9.2* 9.1*  HCT 28.3* 28.0*  PLT 830* 865*   BMET No results for input(s): NA, K, CL, CO2, GLUCOSE, BUN, CREATININE, CALCIUM in the last 72 hours. PT/INR No results for input(s): LABPROT, INR in the last 72 hours. CMP     Component Value Date/Time   NA 137 10/02/2016 0442   K 3.9 10/02/2016 0442   CL 104 10/02/2016 0442   CO2 25 10/02/2016 0442   GLUCOSE 97 10/02/2016 0442   BUN 9 10/02/2016 0442   CREATININE 0.95 10/02/2016 0442   CALCIUM 8.3 (L) 10/02/2016 0442   PROT 6.1 (L) 09/26/2016 0502   ALBUMIN 2.8 (L) 09/26/2016 0502   AST 21 09/26/2016 0502   ALT 18 09/26/2016 0502   ALKPHOS 104 09/26/2016 0502   BILITOT 1.2 09/26/2016 0502   GFRNONAA >60 10/02/2016 0442   GFRAA >60 10/02/2016 0442   Lipase     Component Value Date/Time   LIPASE 30 09/21/2016 0925   Studies/Results: Ct Abdomen Pelvis W Contrast  Result Date: 10/03/2016 CLINICAL DATA:  Intermittent abdominal pain, loose bowel movements EXAM: CT ABDOMEN AND PELVIS WITH CONTRAST TECHNIQUE: Multidetector CT imaging of the abdomen and pelvis was  performed using the standard protocol following bolus administration of intravenous contrast. CONTRAST:  165mL ISOVUE-300 IOPAMIDOL (ISOVUE-300) INJECTION 61% COMPARISON:  CT abdomen pelvis of 09/30/2016 FINDINGS: Lower chest: There is persistent right pleural effusion present with partial atelectasis of the right lower lobe. The left lung base is clear other than mild linear atelectasis. Hepatobiliary: The liver enhances with no focal abnormality and no ductal dilatation is seen. There is a small collection of fluid along the tip of the right lobe of liver posteriorly measuring 16 mm in diameter which was present previously and may represent a small abdominal abscess perihepatic in location. No calcified gallstones are seen. Pancreas: The pancreas is normal in size and the pancreatic duct is not dilated. Spleen: The spleen is unremarkable. Adrenals/Urinary Tract: The adrenal glands appear symmetrical and normal. The kidneys enhance with no calculus or mass and there is no evidence of hydronephrosis. The ureters appear normal in caliber. The urinary bladder is unremarkable being moderately distended with urine. Stomach/Bowel: The stomach is moderately distended with oral contrast and air. No small bowel distention is seen. The colon is largely decompressed and there is oral contrast throughout the nondistended colon. There are rectosigmoid colon is very elongated and redundant extending upright of the pelvis. Adjacent to the rectosigmoid colon within the pelvic lower pelvis there are several fluid collections present with some air within these collections. I have measured approximately 5 of these collections within  the pelvis most consistent with abscesses, the largest of 2.9 x 1.9 cm. Some of these collections were present previously, but others have formed into more discrete collections currently. Vascular/Lymphatic: The abdominal aorta is normal in caliber with mild to moderate abdominal aortic atherosclerosis  present. The distal abdominal aorta measures 3.4 cm in diameter consistent with a small distal abdominal aortic aneurysm. Reproductive: The prostate is normal in size. There is a small amount of free air within the peritoneal cavity some of which could be due to the recent surgical intervention although perforated bowel cannot be excluded. Other: None Musculoskeletal: The lumbar vertebrae are in normal alignment with normal intervertebral disc spaces. IMPRESSION: 1. At least 6 small fluid collections, five of which are within the pelvis most consistent with abscesses containing air bubbles as well. 2. Small amount of free intraperitoneal air may be postoperative, but perforated bowel cannot be excluded. 3. No change in moderate size right pleural effusion with right basilar atelectasis. 4. Fusiform dilatation of the distal abdominal aorta up to 3.4 cm consistent with aneurysm. Electronically Signed   By: Ivar Drape M.D.   On: 10/03/2016 16:53    Anti-infectives: Anti-infectives    Start     Dose/Rate Route Frequency Ordered Stop   10/01/16 1400  meropenem (MERREM) 1 g in sodium chloride 0.9 % 100 mL IVPB     1 g 200 mL/hr over 30 Minutes Intravenous Every 8 hours 10/01/16 1105     10/01/16 1000  levofloxacin (LEVAQUIN) IVPB 750 mg  Status:  Discontinued     750 mg 100 mL/hr over 90 Minutes Intravenous Every 24 hours 10/01/16 0920 10/01/16 1104   09/27/16 1100  ertapenem (INVANZ) 1 g in sodium chloride 0.9 % 50 mL IVPB  Status:  Discontinued     1 g 100 mL/hr over 30 Minutes Intravenous Every 24 hours 09/27/16 1005 10/01/16 1104   09/27/16 1100  fluconazole (DIFLUCAN) IVPB 200 mg     200 mg 100 mL/hr over 60 Minutes Intravenous Every 24 hours 09/27/16 1005     09/26/16 1645  piperacillin-tazobactam (ZOSYN) IVPB 3.375 g     3.375 g 12.5 mL/hr over 240 Minutes Intravenous  Once 09/26/16 1639 09/26/16 2045   09/26/16 1200  piperacillin-tazobactam (ZOSYN) IVPB 3.375 g  Status:  Discontinued      3.375 g 12.5 mL/hr over 240 Minutes Intravenous Every 8 hours 09/26/16 0719 09/26/16 0830   09/26/16 0845  piperacillin-tazobactam (ZOSYN) IVPB 3.375 g  Status:  Discontinued     3.375 g 12.5 mL/hr over 240 Minutes Intravenous Every 8 hours 09/26/16 0830 09/27/16 1005   09/22/16 1200  piperacillin-tazobactam (ZOSYN) IVPB 3.375 g     3.375 g 12.5 mL/hr over 240 Minutes Intravenous Every 8 hours 09/22/16 1059 09/25/16 0847   09/21/16 1400  cefoTEtan (CEFOTAN) 2 g in dextrose 5 % 50 mL IVPB     2 g 100 mL/hr over 30 Minutes Intravenous On call to O.R. 09/21/16 1246 09/21/16 1740   09/21/16 1300  cefTRIAXone (ROCEPHIN) 2 g in dextrose 5 % 50 mL IVPB  Status:  Discontinued    Comments:  Pharmacy may adjust dosing strength / duration / interval for maximal efficacy   2 g 100 mL/hr over 30 Minutes Intravenous Every 24 hours 09/21/16 1136 09/22/16 1056   09/21/16 1130  cefoTEtan (CEFOTAN) 2 g in dextrose 5 % 50 mL IVPB  Status:  Discontinued     2 g 100 mL/hr over 30 Minutes Intravenous On call  to O.R. 09/21/16 1125 09/21/16 1247     Assessment/Plan Status post lap appendectomy & laparoscopy with drain placement POD#6/11 - WBC trending down - repeat CT 12/4 significant for RLL pleural effusion and consolidation. 4 x 2 cm fluid collection near sigmoid - not amenable to IR drainage - IV abx and follow  - JP drain removed 12/6 - Repeat CT 12/7 significant for multiple small fluid collections in the pelvis and persistent R pleural effusion - Encouraged OOB, ambulation - IS/pulm toilet  HCAP - meropenem   FEN: soft diet ID: Zosyn 11/25-11/30, Invanz 12/1-12/5, meropenem 12/5>> VTE: Lovenox, SCD's  Plan: improving leukocytosis,  Continue IV abx.  IR consulted - no good window for drainage; allow clears, re-start anticoagulation.  WBC trending down - CBC in AM.   LOS: 14 days    Rosario Adie, MD  [facility]

## 2016-10-05 NOTE — Progress Notes (Signed)
[facility] for [facility]ious [facility]  Date of Admission:  09/21/2016   Total days of antibiotics 9        Day 9 fluconazole        Day 5 meropenem         Principal Problem:   Acute appendicitis s/p lap appendectomy 09/21/2016 Active Problems:   AAA (abdominal aortic aneurysm)    Essential hypertension   Carcinoid tumor of rectum s/p removal 2012   Hyperlipidemia   Tobacco abuse   Constipation, chronic   Inguinal hernia, right   Heartburn   . amLODipine  5 mg Oral Daily  . enoxaparin (LOVENOX) injection  40 mg Subcutaneous Q24H  . famotidine (PEPCID) IV  20 mg Intravenous Q12H  . feeding supplement  1 Container Oral TID BM  . fluconazole (DIFLUCAN) IV  200 mg Intravenous Q24H  . iopamidol  30 mL Oral Once  . lip balm  1 application Topical BID  . losartan  50 mg Oral Daily  . meropenem (MERREM) IV  1 g Intravenous Q8H  . nicotine  21 mg Transdermal Daily    SUBJECTIVE: He is feeling better today. His right-sided chest and abdominal pain has improved. His appetite is improved and he is hungry.  Review of Systems: Review of Systems  Constitutional: Negative for chills, diaphoresis and fever.  Respiratory: Negative for cough and shortness of breath.   Cardiovascular: Positive for chest pain.  Gastrointestinal: Positive for abdominal pain. Negative for diarrhea, nausea and vomiting.    Past Medical History:  Diagnosis Date  . AAA (abdominal aortic aneurysm)  09/21/2016   3.3cm on CT scan 09/21/2016  . 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 Difficult intubation 09/21/2016  . Hyperlipidemia 05/27/2013  . Hypertension   . Vitamin D deficiency 05/27/2013   Overview:  10/1 IMO update    Social History  Substance Use Topics  . Smoking status: Current Every Day Smoker    Packs/day: 0.50    Years: 40.00    Types: Cigarettes  . Smokeless tobacco: Never Used  . Alcohol use No    History  reviewed. No pertinent family history. Allergies  Allergen Reactions  . Lisinopril Cough  . Simvastatin Other (See Comments)    myalgia    OBJECTIVE: Vitals:   10/04/16 1400 10/04/16 2100 10/05/16 0545 10/05/16 1001  BP: 135/66 (!) 141/63 137/64 128/77  Pulse: 83 89 84 61  Resp: 18 18 18    Temp: 98.9 F (37.2 C) 99.5 F (37.5 C) 99 F (37.2 C)   TempSrc: Oral Oral Oral   SpO2: 95% 97% 98%   Weight:      Height:       Body mass index is 28.7 kg/m.  Physical Exam  Constitutional: He is oriented to person, place, and time.  He is alert and comfortable resting quietly in bed.  Cardiovascular: Normal rate and regular rhythm.   No murmur heard. Pulmonary/Chest: Effort normal and breath sounds normal. He has no wheezes. He has no rales.  Abdominal: Soft. Bowel sounds are normal. He exhibits distension. There is tenderness.  Mild right upper quadrant tenderness.  Neurological: He is alert and oriented to person, place, and time.  Skin: No rash noted.  Psychiatric: Mood and affect normal.    Lab Results Lab Results  Component Value Date   WBC 18.6 (H) 10/05/2016   HGB 9.1 (L) 10/05/2016  HCT 28.0 (L) 10/05/2016   MCV 92.4 10/05/2016   PLT 865 (H) 10/05/2016    Lab Results  Component Value Date   CREATININE 0.95 10/02/2016   BUN 9 10/02/2016   NA 137 10/02/2016   K 3.9 10/02/2016   CL 104 10/02/2016   CO2 25 10/02/2016    Lab Results  Component Value Date   ALT 18 09/26/2016   AST 21 09/26/2016   ALKPHOS 104 09/26/2016   BILITOT 1.2 09/26/2016     Microbiology: Recent Results (from the past 240 hour(s))  Surgical pcr screen     Status: Abnormal   Collection Time: 09/26/16 11:35 AM  Result Value Ref Range Status   MRSA, PCR INVALID RESULTS, SPECIMEN SENT FOR CULTURE (A) NEGATIVE Final    Comment: RESULT CALLED TO, READ BACK BY AND VERIFIED WITH: AFTERHOURS AT 1600 ON 09/26/16 BY S.VANHOORNE    Staphylococcus aureus INVALID RESULTS, SPECIMEN SENT FOR  CULTURE (A) NEGATIVE Final    Comment: RESULT CALLED TO, READ BACK BY AND VERIFIED WITH: AFTERHOURS AT 1600 ON 09/26/16 BY S.VANHOORNE        The Xpert SA Assay (FDA approved for NASAL specimens in patients over 15 years of age), is one component of a comprehensive surveillance program.  Test performance has been validated by [facility] for patients greater than or equal to 43 year old. It is not intended to diagnose infection nor to guide or monitor treatment.   MRSA culture     Status: None   Collection Time: 09/26/16 11:35 AM  Result Value Ref Range Status   Specimen Description NASAL SWAB  Final   Special Requests CONFIRM INVALID MRSA PCR RESULT  Final   Culture   Final    NO MRSA DETECTED Performed at [facility]    Report Status 09/28/2016 FINAL  Final  Aerobic/Anaerobic Culture (surgical/deep wound)     Status: None   Collection Time: 09/26/16  1:55 PM  Result Value Ref Range Status   Specimen Description TISSUE PERITONEAL  Final   Special Requests NONE  Final   Gram Stain   Final    FEW WBC PRESENT,BOTH PMN AND MONONUCLEAR NO ORGANISMS SEEN Gram Stain Report Called to,Read Back By and Verified With: HERRSHAFT,M @ [id number] ON [id number] BY POTEAT,S    Culture   Final    RARE BACILLUS SPECIES CRITICAL RESULT CALLED TO, READ BACK BY AND VERIFIED WITH: D TORRES 09/29/16 @ [place] Performed at [facility]    Report Status 10/01/2016 FINAL  Final     ASSESSMENT: He continues to improve slowly on broad empiric antibiotic therapy. I will continue fluconazole and meropenem through the weekend.  PLAN: 1. Continue current antibiotic therapy 2. Please call me for any infectious disease questions this weekend  Michel Bickers, MD [facility] for [place] [phone] pager   [phone] cell 10/05/2016, 11:10 AMPatient ID: Devin Goodwin, male   DOB: [birth year], 60 y.o.   MRN:  [id number]

## 2016-10-06 MED ORDER — FAMOTIDINE 20 MG PO TABS
20.0000 mg | ORAL_TABLET | Freq: Two times a day (BID) | ORAL | Status: DC
  Administered 2016-10-07 – 2016-10-08 (×4): 20 mg via ORAL
  Filled 2016-10-06 (×4): qty 1

## 2016-10-06 MED ORDER — SODIUM CHLORIDE 0.9% FLUSH
3.0000 mL | Freq: Two times a day (BID) | INTRAVENOUS | Status: DC
  Administered 2016-10-06 – 2016-10-07 (×3): 3 mL via INTRAVENOUS

## 2016-10-06 MED ORDER — SODIUM CHLORIDE 0.9% FLUSH: 3.0000 mL | INTRAVENOUS | Status: DC | PRN

## 2016-10-06 NOTE — Progress Notes (Signed)
[facility] Progress Note  10 Days Post-Op  Subjective: Feeling better. Ambulating and having loose bowel movements. Denies nausea or vomiting.  Tolerating soft diet.  Had a fever (101.3) yesterday.  Objective: Vital signs in last 24 hours: Temp:  [98.7 F (37.1 C)-101.3 F (38.5 C)] 99.5 F (37.5 C) (12/10 0554) Pulse Rate:  [61-87] 84 (12/10 0554) Resp:  [16-18] 16 (12/10 0554) BP: (128-141)/(62-77) 139/62 (12/10 0554) SpO2:  [96 %] 96 % (12/10 0554) Last BM Date: 10/05/16  Intake/Output from previous day: 12/09 0701 - 12/10 0700 In: 4505 [P.O.:1755; I.V.:2250; IV Piggyback:500] Out: [id number] [Urine:3455] Intake/Output this shift: No intake/output data recorded.  PE: Gen:  Alert, NAD, cooperative Abd: Soft, Mild TTP lower abdomen. incisions C/D/I Ext:  No erythema, edema, or tenderness   Lab Results:   Recent Labs  10/04/16 0824 10/05/16 0501  WBC 22.1* 18.6*  HGB 9.2* 9.1*  HCT 28.3* 28.0*  PLT 830* 865*   BMET No results for input(s): NA, K, CL, CO2, GLUCOSE, BUN, CREATININE, CALCIUM in the last 72 hours. PT/INR No results for input(s): LABPROT, INR in the last 72 hours. CMP     Component Value Date/Time   NA 137 10/02/2016 0442   K 3.9 10/02/2016 0442   CL 104 10/02/2016 0442   CO2 25 10/02/2016 0442   GLUCOSE 97 10/02/2016 0442   BUN 9 10/02/2016 0442   CREATININE 0.95 10/02/2016 0442   CALCIUM 8.3 (L) 10/02/2016 0442   PROT 6.1 (L) 09/26/2016 0502   ALBUMIN 2.8 (L) 09/26/2016 0502   AST 21 09/26/2016 0502   ALT 18 09/26/2016 0502   ALKPHOS 104 09/26/2016 0502   BILITOT 1.2 09/26/2016 0502   GFRNONAA >60 10/02/2016 0442   GFRAA >60 10/02/2016 0442   Lipase     Component Value Date/Time   LIPASE 30 09/21/2016 0925   Studies/Results: No results found.  Anti-infectives: Anti-infectives    Start     Dose/Rate Route Frequency Ordered Stop   10/01/16 1400  meropenem (MERREM) 1 g in sodium chloride 0.9 % 100 mL IVPB     1 g 200  mL/hr over 30 Minutes Intravenous Every 8 hours 10/01/16 1105     10/01/16 1000  levofloxacin (LEVAQUIN) IVPB 750 mg  Status:  Discontinued     750 mg 100 mL/hr over 90 Minutes Intravenous Every 24 hours 10/01/16 0920 10/01/16 1104   09/27/16 1100  ertapenem (INVANZ) 1 g in sodium chloride 0.9 % 50 mL IVPB  Status:  Discontinued     1 g 100 mL/hr over 30 Minutes Intravenous Every 24 hours 09/27/16 1005 10/01/16 1104   09/27/16 1100  fluconazole (DIFLUCAN) IVPB 200 mg     200 mg 100 mL/hr over 60 Minutes Intravenous Every 24 hours 09/27/16 1005     09/26/16 1645  piperacillin-tazobactam (ZOSYN) IVPB 3.375 g     3.375 g 12.5 mL/hr over 240 Minutes Intravenous  Once 09/26/16 1639 09/26/16 2045   09/26/16 1200  piperacillin-tazobactam (ZOSYN) IVPB 3.375 g  Status:  Discontinued     3.375 g 12.5 mL/hr over 240 Minutes Intravenous Every 8 hours 09/26/16 0719 09/26/16 0830   09/26/16 0845  piperacillin-tazobactam (ZOSYN) IVPB 3.375 g  Status:  Discontinued     3.375 g 12.5 mL/hr over 240 Minutes Intravenous Every 8 hours 09/26/16 0830 09/27/16 1005   09/22/16 1200  piperacillin-tazobactam (ZOSYN) IVPB 3.375 g     3.375 g 12.5 mL/hr over 240 Minutes Intravenous Every 8 hours 09/22/16 1059  09/25/16 0847   09/21/16 1400  cefoTEtan (CEFOTAN) 2 g in dextrose 5 % 50 mL IVPB     2 g 100 mL/hr over 30 Minutes Intravenous On call to O.R. 09/21/16 1246 09/21/16 1740   09/21/16 1300  cefTRIAXone (ROCEPHIN) 2 g in dextrose 5 % 50 mL IVPB  Status:  Discontinued    Comments:  Pharmacy may adjust dosing strength / duration / interval for maximal efficacy   2 g 100 mL/hr over 30 Minutes Intravenous Every 24 hours 09/21/16 1136 09/22/16 1056   09/21/16 1130  cefoTEtan (CEFOTAN) 2 g in dextrose 5 % 50 mL IVPB  Status:  Discontinued     2 g 100 mL/hr over 30 Minutes Intravenous On call to O.R. 09/21/16 1125 09/21/16 1247     Assessment/Plan Status post lap appendectomy & laparoscopy with drain  placement POD#7/12 - WBC trending down, recheck in AM - repeat CT 12/4 significant for RLL pleural effusion and consolidation. 4 x 2 cm fluid collection near sigmoid - not amenable to IR drainage - IV abx and follow  - JP drain removed 12/6 - Repeat CT 12/7 significant for multiple small fluid collections in the pelvis and persistent R pleural effusion - Encouraged OOB, ambulation - IS/pulm toilet  HCAP - meropenem   FEN: soft diet ID: Zosyn 11/25-11/30, Invanz 12/1-12/5, meropenem 12/5>>, Fluconazole 12/1 VTE: Lovenox, SCD's  Plan: improving leukocytosis,  Still with intermittent fevers.  Continue IV abx.  ID following  IR consulted - no good window for drainage WBC trending down - CBC in AM.     LOS: 15 days    Rosario Adie, MD  [facility]

## 2016-10-06 NOTE — Progress Notes (Signed)
PHARMACIST - PHYSICIAN COMMUNICATION  DR:   Marcello Moores  CONCERNING: IV to Oral Route Change Policy  RECOMMENDATION: This patient is receiving Pepcid by the intravenous route.  Based on criteria approved by the Pharmacy and Therapeutics Committee, the intravenous medication(s) is/are being converted to the equivalent oral dose form(s).   DESCRIPTION: These criteria include:  The patient is eating (either orally or via tube) and/or has been taking other orally administered medications for a least 24 hours  The patient has no evidence of active gastrointestinal bleeding or impaired GI absorption (gastrectomy, short bowel, patient on TNA or NPO).  If you have questions about this conversion, please contact the Pharmacy Department  []   [phone] )  Devin Goodwin []   [phone] )  [facility] []   [phone] )  Devin Goodwin []   [phone] )  [facility] [x]   [phone] )  [place], [facility] 10/06/2016 12:23 PM

## 2016-10-07 LAB — CBC
HEMATOCRIT: 26 % — AB (ref 39.0–52.0)
HEMOGLOBIN: 8.6 g/dL — AB (ref 13.0–17.0)
MCH: 29.4 pg (ref 26.0–34.0)
MCHC: 33.1 g/dL (ref 30.0–36.0)
MCV: 88.7 fL (ref 78.0–100.0)
PLATELETS: 805 10*3/uL — AB (ref 150–400)
RBC: 2.93 MIL/uL — AB (ref 4.22–5.81)
RDW: 13.7 % (ref 11.5–15.5)
WBC: 18.8 10*3/uL — AB (ref 4.0–10.5)

## 2016-10-07 NOTE — Progress Notes (Signed)
Nutrition Follow-up  INTERVENTION:   Recommend new weight be obtained (Last recorded 11/25).  Continue Boost Breeze po TID, each supplement provides 250 kcal and 9 grams of protein Encourage PO intake RD to continue to monitor  NUTRITION DIAGNOSIS:   Inadequate oral intake related to altered GI function as evidenced by NPO status.  Resolved.  GOAL:   Patient will meet greater than or equal to 90% of their needs  Progressing.  MONITOR:   PO intake, Supplement acceptance, Weight trends, Labs, Skin, I & O's  ASSESSMENT:   60 y/o male s/p appendectomy 11/25, now with illeus, abdominal distention and bloating, hypoactive bowel sounds, and not passing flatus. Pt scheduled to have exploratory lap today and possibly to start TPN in new couple of days.    Patient consuming 75% of meals at this time. Pt tolerating soft diet and drinking supplements ordered (Boost Breeze TID providing 750 kcal, 27g protein). Pt is having BMs.   Labs reviewed. Medications reviewed.   Diet Order:  Diet - low sodium heart healthy DIET SOFT Room service appropriate? Yes; Fluid consistency: Thin  Skin:  Wound (see comment) (abd incisions )  Last BM:  12/10  Height:   Ht Readings from Last 1 Encounters:  09/21/16 5\' 10"  (1.778 m)    Weight:   Wt Readings from Last 1 Encounters:  09/21/16 200 lb (90.7 kg)    Ideal Body Weight:  75.5 kg  BMI:  Body mass index is 28.7 kg/m.  Estimated Nutritional Needs:   Kcal:  2000-2200  Protein:  105-115g  Fluid:  2L/day   EDUCATION NEEDS:   No education needs identified at this time  Clayton Bibles, MS, RD, LDN Pager: [phone] After Hours Pager: [phone]

## 2016-10-07 NOTE — Progress Notes (Signed)
[facility] Progress Note  11 Days Post-Op  Subjective: Frustrated with prolonged hospital course. Pain in right lower chest and lower back improving. Tolerating diet. Having BMs every other day. Denies nausea, vomiting, chills, or night sweats.  Objective: Vital signs in last 24 hours: Temp:  [98.3 F (36.8 C)-99.1 F (37.3 C)] 99.1 F (37.3 C) (12/11 0548) Pulse Rate:  [79-87] 79 (12/11 0548) Resp:  [16] 16 (12/11 0548) BP: (123-136)/(54-70) 123/70 (12/11 0548) SpO2:  [95 %-97 %] 96 % (12/11 0548) Last BM Date: 10/06/16  Intake/Output from previous day: 12/10 0701 - 12/11 0700 In: 2790 [P.O.:1440; I.V.:950; IV Piggyback:400] Out: 2300 [Urine:2300] Intake/Output this shift: Total I/O In: -  Out: 600 [Urine:600]  PE: Gen:  Alert, NAD, pleasant and cooperative Pulm:  CTABL, unlabored Abd: Soft, NT, mild distention, +BS, incisions C/D/I Ext:  No erythema, edema, or tenderness   Lab Results:   Recent Labs  10/05/16 0501 10/07/16 0505  WBC 18.6* 18.8*  HGB 9.1* 8.6*  HCT 28.0* 26.0*  PLT 865* 805*   BMET No results for input(s): NA, K, CL, CO2, GLUCOSE, BUN, CREATININE, CALCIUM in the last 72 hours. PT/INR No results for input(s): LABPROT, INR in the last 72 hours. CMP     Component Value Date/Time   NA 137 10/02/2016 0442   K 3.9 10/02/2016 0442   CL 104 10/02/2016 0442   CO2 25 10/02/2016 0442   GLUCOSE 97 10/02/2016 0442   BUN 9 10/02/2016 0442   CREATININE 0.95 10/02/2016 0442   CALCIUM 8.3 (L) 10/02/2016 0442   PROT 6.1 (L) 09/26/2016 0502   ALBUMIN 2.8 (L) 09/26/2016 0502   AST 21 09/26/2016 0502   ALT 18 09/26/2016 0502   ALKPHOS 104 09/26/2016 0502   BILITOT 1.2 09/26/2016 0502   GFRNONAA >60 10/02/2016 0442   GFRAA >60 10/02/2016 0442   Lipase     Component Value Date/Time   LIPASE 30 09/21/2016 0925       Studies/Results: No results found.  Anti-infectives: Anti-infectives    Start     Dose/Rate Route Frequency  Ordered Stop   10/01/16 1400  meropenem (MERREM) 1 g in sodium chloride 0.9 % 100 mL IVPB     1 g 200 mL/hr over 30 Minutes Intravenous Every 8 hours 10/01/16 1105     10/01/16 1000  levofloxacin (LEVAQUIN) IVPB 750 mg  Status:  Discontinued     750 mg 100 mL/hr over 90 Minutes Intravenous Every 24 hours 10/01/16 0920 10/01/16 1104   09/27/16 1100  ertapenem (INVANZ) 1 g in sodium chloride 0.9 % 50 mL IVPB  Status:  Discontinued     1 g 100 mL/hr over 30 Minutes Intravenous Every 24 hours 09/27/16 1005 10/01/16 1104   09/27/16 1100  fluconazole (DIFLUCAN) IVPB 200 mg     200 mg 100 mL/hr over 60 Minutes Intravenous Every 24 hours 09/27/16 1005     09/26/16 1645  piperacillin-tazobactam (ZOSYN) IVPB 3.375 g     3.375 g 12.5 mL/hr over 240 Minutes Intravenous  Once 09/26/16 1639 09/26/16 2045   09/26/16 1200  piperacillin-tazobactam (ZOSYN) IVPB 3.375 g  Status:  Discontinued     3.375 g 12.5 mL/hr over 240 Minutes Intravenous Every 8 hours 09/26/16 0719 09/26/16 0830   09/26/16 0845  piperacillin-tazobactam (ZOSYN) IVPB 3.375 g  Status:  Discontinued     3.375 g 12.5 mL/hr over 240 Minutes Intravenous Every 8 hours 09/26/16 0830 09/27/16 1005   09/22/16 1200  piperacillin-tazobactam (  ZOSYN) IVPB 3.375 g     3.375 g 12.5 mL/hr over 240 Minutes Intravenous Every 8 hours 09/22/16 1059 09/25/16 0847   09/21/16 1400  cefoTEtan (CEFOTAN) 2 g in dextrose 5 % 50 mL IVPB     2 g 100 mL/hr over 30 Minutes Intravenous On call to O.R. 09/21/16 1246 09/21/16 1740   09/21/16 1300  cefTRIAXone (ROCEPHIN) 2 g in dextrose 5 % 50 mL IVPB  Status:  Discontinued    Comments:  Pharmacy may adjust dosing strength / duration / interval for maximal efficacy   2 g 100 mL/hr over 30 Minutes Intravenous Every 24 hours 09/21/16 1136 09/22/16 1056   09/21/16 1130  cefoTEtan (CEFOTAN) 2 g in dextrose 5 % 50 mL IVPB  Status:  Discontinued     2 g 100 mL/hr over 30 Minutes Intravenous On call to O.R. 09/21/16 1125  09/21/16 1247       Assessment/Plan Status post lap appendectomy & laparoscopy with drain placement POD#7/12 - WBC18.8 from 18.6 yesterday - repeat CT 12/4 significant for RLL pleural effusion and consolidation. 4 x 2 cm fluid collection near sigmoid - not amenable to IR drainage - IV abx and follow  - JP drain removed 12/6 - Repeat CT 12/7 significant for multiple small fluid collections in the pelvis and persistent R pleural effusion - Encouraged OOB, ambulation - IS/pulm toilet  HCAP - meropenem   FEN: soft diet ID: Zosyn 11/25-11/30, Invanz 12/1-12/5, meropenem 12/5>>, Fluconazole 12/1 VTE: Lovenox, SCD's   Plan: stable leukocytosis,  afebrile for >24h.  Continue IV abx. CBC in AM Will discuss duration of abx therapy (PICC line for IV abx at home?) as well as repeat CT scan with MD. ID following - appreciate recommendations   LOS: 16 days    Jill Alexanders , [facility] 10/07/2016, 9:35 AM Pager: [phone] Consults: [phone] Mon-Fri 7:00 am-4:30 pm Sat-Sun 7:00 am-11:30 am

## 2016-10-07 NOTE — Progress Notes (Signed)
    [facility] for Infectious Disease   Reason for visit: Follow up on abdominal abscess  Interval History: afebrile, WBC stable at 18; wants to go home when possible  Physical Exam: Constitutional:  Vitals:   10/07/16 0954 10/07/16 1400  BP: 104/78 (!) 141/65  Pulse: 81 82  Resp: 20 18  Temp:  99 F (37.2 C)   patient appears in NAD Respiratory: Normal respiratory effort; CTA B Cardiovascular: RRR  Review of Systems: Constitutional: negative for fevers and chills Gastrointestinal: negative for diarrhea Integument/breast: negative for rash  Lab Results  Component Value Date   WBC 18.8 (H) 10/07/2016   HGB 8.6 (L) 10/07/2016   HCT 26.0 (L) 10/07/2016   MCV 88.7 10/07/2016   PLT 805 (H) 10/07/2016    Lab Results  Component Value Date   CREATININE 0.95 10/02/2016   BUN 9 10/02/2016   NA 137 10/02/2016   K 3.9 10/02/2016   CL 104 10/02/2016   CO2 25 10/02/2016    Lab Results  Component Value Date   ALT 18 09/26/2016   AST 21 09/26/2016   ALKPHOS 104 09/26/2016     Microbiology: No results found for this or any previous visit (from the past 240 hour(s)).  Impression/Plan:  1. Intraabdominal abscess - seems to be improving.  On meropenem and fluconazole. From an ID standpoint, picc line and treatment with home health is ok and I will have home health see the patient.  I do feel at this point IV therapy would be optimal with Invanz 1 gram daily and oral fluconazole 200 mg daily.  Repeat CT then in 5-7 days or whenever felt to be appropriate by surgery.   2. Leukocytosis - stable today and improved from previous.

## 2016-10-08 LAB — CBC
HCT: 29.8 % — ABNORMAL LOW (ref 39.0–52.0)
Hemoglobin: 9.7 g/dL — ABNORMAL LOW (ref 13.0–17.0)
MCH: 29.8 pg (ref 26.0–34.0)
MCHC: 32.6 g/dL (ref 30.0–36.0)
MCV: 91.4 fL (ref 78.0–100.0)
PLATELETS: 939 10*3/uL — AB (ref 150–400)
RBC: 3.26 MIL/uL — ABNORMAL LOW (ref 4.22–5.81)
RDW: 13.7 % (ref 11.5–15.5)
WBC: 18 10*3/uL — ABNORMAL HIGH (ref 4.0–10.5)

## 2016-10-08 LAB — PATHOLOGIST SMEAR REVIEW

## 2016-10-08 MED ORDER — SODIUM CHLORIDE 0.9% FLUSH: 10.0000 mL | INTRAVENOUS | Status: DC | PRN

## 2016-10-08 MED ORDER — BOOST / RESOURCE BREEZE PO LIQD: 1.0000 | Freq: Three times a day (TID) | ORAL | 0 refills | Status: AC

## 2016-10-08 MED ORDER — FLUCONAZOLE 100 MG PO TABS: 200.0000 mg | ORAL_TABLET | Freq: Every day | ORAL | 0 refills | Status: AC

## 2016-10-08 MED ORDER — SODIUM CHLORIDE 0.9 % IV SOLN: 1.0000 g | INTRAVENOUS | 0 refills | Status: AC

## 2016-10-08 MED ORDER — HEPARIN SOD (PORK) LOCK FLUSH 100 UNIT/ML IV SOLN
250.0000 [IU] | INTRAVENOUS | Status: AC | PRN
  Administered 2016-10-08: 250 [IU]

## 2016-10-08 MED ORDER — SODIUM CHLORIDE 0.9 % IV SOLN
1.0000 g | Freq: Once | INTRAVENOUS | Status: AC
  Administered 2016-10-08: 1 g via INTRAVENOUS
  Filled 2016-10-08: qty 1

## 2016-10-08 MED ORDER — FAMOTIDINE 20 MG PO TABS: 20.0000 mg | ORAL_TABLET | Freq: Two times a day (BID) | ORAL | 0 refills | Status: AC

## 2016-10-08 NOTE — Progress Notes (Signed)
    [facility] for Infectious Disease   Reason for visit: Follow up on intraabdominal abscess  Interval History: discharging today  Physical Exam: Constitutional:  Vitals:   10/08/16 0530 10/08/16 0949  BP: 126/65 112/60  Pulse: 76   Resp: 16   Temp: 98 F (36.7 C)    patient appears in NAD  Impression: intra-abdominal abscess  Plan: 1. IV ertapenem for 2 weeks 2. Oral fluconazole for 2 weeks 3.  We will arrange follow up next week after repeat CT scan to determine duration/resolution

## 2016-10-08 NOTE — Progress Notes (Signed)
Patient to d/c home with IV abx, contacted [facility] to facilitate arrangements. Patient awaiting PICC placement.

## 2016-10-08 NOTE — Progress Notes (Signed)
Peripherally Inserted Central Catheter/Midline Placement  The IV Nurse has discussed with the patient and/or persons authorized to consent for the patient, the purpose of this procedure and the potential benefits and risks involved with this procedure.  The benefits include less needle sticks, lab draws from the catheter, and the patient may be discharged home with the catheter. Risks include, but not limited to, infection, bleeding, blood clot (thrombus formation), and puncture of an artery; nerve damage and irregular heartbeat and possibility to perform a PICC exchange if needed/ordered by physician.  Alternatives to this procedure were also discussed.  Bard Power PICC patient education guide, fact sheet on infection prevention and patient information card has been provided to patient /or left at bedside.    PICC/Midline Placement Documentation  PICC Single Lumen [id number] PICC Right Basilic 43 cm 1 cm (Active)  Indication for Insertion or Continuance of Line Home intravenous therapies (PICC only) 10/08/2016 11:00 AM  Exposed Catheter (cm) 1 cm 10/08/2016 11:00 AM  Dressing Change Due 10/15/16 10/08/2016 11:00 AM       Devin Goodwin 10/08/2016, 11:52 AM

## 2016-10-08 NOTE — Discharge Instructions (Signed)
Get a colonoscopy.  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  Start with a pureed / full liquid diet (see below)  WALK Walk an hour a day.  Control your pain to do that.    CONTROL PAIN Control pain so that you can walk, sleep, tolerate sneezing/coughing, go up/down stairs.  HAVE A BOWEL MOVEMENT DAILY Keep your bowels regular to avoid problems.  OK to try a laxative to override constipation.  OK to use an antidairrheal to slow down diarrhea.  Call if not better after 2 tries  CALL IF YOU HAVE PROBLEMS/CONCERNS Call if you are still struggling despite following these instructions. Call if you have concerns not answered by these instructions  ######################################################################    1. DIET: Follow a light bland diet the first 24 hours after arrival home, such as soup, liquids, crackers, etc.  Be sure to include lots of fluids daily.  Avoid fast food or heavy meals as your are more likely to get nauseated.  Eat a low fat the next few days after surgery.   2. Take your usually prescribed home medications unless otherwise directed. 3. PAIN CONTROL: a. Pain is best controlled by a usual combination of three different methods TOGETHER: i. Ice/Heat ii. Over the counter pain medication iii. Prescription pain medication b. Most patients will experience some swelling and bruising around the incisions.  Ice packs or heating pads (30-60 minutes up to 6 times a day) will help. Use ice for the first few days to help decrease swelling and bruising, then switch to heat to help relax tight/sore spots and speed recovery.  Some people prefer to use ice alone, heat alone, alternating between ice & heat.  Experiment to what works for you.   Swelling and bruising can take several weeks to resolve.   c. It is helpful to take an over-the-counter pain medication regularly for the first few weeks.  Choose one of the following that works best for you: i. Naproxen (Aleve, etc)  Two 220mg  tabs twice a day ii. Ibuprofen (Advil, etc) Three 200mg  tabs four times a day (every meal & bedtime) iii. Acetaminophen (Tylenol, etc) 500-650mg  four times a day (every meal & bedtime) d. A  prescription for pain medication (such as oxycodone, hydrocodone, etc) should be given to you upon discharge.  Take your pain medication as prescribed.  i. If you are having problems/concerns with the prescription medicine (does not control pain, nausea, vomiting, rash, itching, etc), please call us [phone] to see if we need to switch you to a different pain medicine that will work better for you and/or control your side effect better. ii. If you need a refill on your pain medication, please contact your pharmacy.  They will contact our office to request authorization. Prescriptions will not be filled after 5 pm or on week-ends. 4. Avoid getting constipated.  Between the surgery and the pain medications, it is common to experience some constipation.  Increasing fluid intake and taking a fiber supplement (such as Metamucil, Citrucel, FiberCon, MiraLax, etc) 1-2 times a day regularly will usually help prevent this problem from occurring.  A mild laxative (prune juice, Milk of Magnesia, MiraLax, etc) should be taken according to package directions if there are no bowel movements after 48 hours.   5. Watch out for diarrhea.  If  you have many loose bowel movements, simplify your diet to bland foods & liquids for a few days.  Stop any stool softeners and decrease your fiber supplement.  Switching to mild anti-diarrheal medications (Kayopectate, Pepto Bismol) can help.  If this worsens or does not improve, please call us. 6. Wash / shower every day.  You may shower over the  dressings as they are waterproof.  Continue to shower over incision(s) after the dressing is off. 7. Remove your waterproof bandages 5 days after surgery.  You may leave the incision open to air.  You may replace a dressing/Band-Aid to cover the incision for comfort if you wish.  8. ACTIVITIES as tolerated:   a. You may resume regular (light) daily activities beginning the next day--such as daily self-care, walking, climbing stairs--gradually increasing activities as tolerated.  If you can walk 30 minutes without difficulty, it is safe to try more intense activity such as jogging, treadmill, bicycling, low-impact aerobics, swimming, etc. b. Save the most intensive and strenuous activity for last such as sit-ups, heavy lifting, contact sports, etc  Refrain from any heavy lifting or straining until you are off narcotics for pain control.   c. DO NOT PUSH THROUGH PAIN.  Let pain be your guide: If it hurts to do something, don't do it.  Pain is your body warning you to avoid that activity for another week until the pain goes down. d. You may drive when you are no longer taking prescription pain medication, you can comfortably wear a seatbelt, and you can safely maneuver your car and apply brakes. e. Dennis Bast may have sexual intercourse when it is comfortable.  9. FOLLOW UP in our office a. Please call [facility] at [phone] to set up an appointment to see your surgeon in the office for a follow-up appointment approximately 2-3 weeks after your surgery. b. Make sure that you call for this appointment the day you arrive home to insure a convenient appointment time. 10. IF YOU HAVE DISABILITY OR FAMILY LEAVE FORMS, BRING THEM TO THE OFFICE FOR PROCESSING.  DO NOT GIVE THEM TO YOUR DOCTOR.   WHEN TO CALL us [phone]: 1. Poor pain control 2. Reactions / problems with new medications (rash/itching, nausea, etc)  3. Fever over 101.5 F (38.5 C) 4. Inability to urinate 5. Nausea and/or vomiting 6. Worsening  swelling or bruising 7. Continued bleeding from incision. 8. Increased pain, redness, or drainage from the incision   The clinic staff is available to answer your questions during regular business hours (8:30am-5pm).  Please dont hesitate to call and ask to speak to one of our nurses for clinical concerns.   If you have a medical emergency, go to the nearest emergency room or call 911.  A surgeon from [facility] is always on call at the [facility], [place], [place], [place], [place]  [address] ? MAIN: [phone] ? TOLL FREE: [phone] ?  FAX [phone] [email or web address]  STOP SMOKING!  We strongly recommend that you stop smoking.  Smoking increases the risk of surgery including infection in the form of an open wound, pus formation, abscess, hernia at an incision on the abdomen, etc.  You have an increased risk of other MAJOR complications such as stroke, heart attack, forming clots in the leg and/or lungs, and death.    Smoking Cessation Quitting smoking is important to your health and has many advantages. However, it is not always easy to  quit since nicotine is a very addictive drug. Often times, people try 3 times or more before being able to quit. This document explains the best ways for you to prepare to quit smoking. Quitting takes hard work and a lot of effort, but you can do it. ADVANTAGES OF QUITTING SMOKING  You will live longer, feel better, and live better.  Your body will feel the impact of quitting smoking almost immediately.  Within 20 minutes, blood pressure decreases. Your pulse returns to its normal level.  After 8 hours, carbon monoxide levels in the blood return to normal. Your oxygen level increases.  After 24 hours, the chance of having a heart attack starts to decrease. Your breath, hair, and body stop smelling like smoke.  After 48 hours, damaged nerve endings begin to recover.  Your sense of taste and smell improve.  After 72 hours, the body is virtually free of nicotine. Your bronchial tubes relax and breathing becomes easier.  After 2 to 12 weeks, lungs can hold more air. Exercise becomes easier and circulation improves.  The risk of having a heart attack, stroke, cancer, or lung disease is greatly reduced.  After 1 year, the risk of coronary heart disease is cut in half.  After 5 years, the risk of stroke falls to the same as a nonsmoker.  After 10 years, the risk of lung cancer is cut in half and the risk of other cancers decreases significantly.  After 15 years, the risk of coronary heart disease drops, usually to the level of a nonsmoker.  If you are pregnant, quitting smoking will improve your chances of having a healthy baby.  The people you live with, especially any children, will be healthier.  You will have extra money to spend on things other than cigarettes. QUESTIONS TO THINK ABOUT BEFORE ATTEMPTING TO QUIT You may want to talk about your answers with your caregiver.  Why do you want to quit?  If you tried to quit in the past, what helped and what did not?  What will be the most difficult situations for you after you quit? How will you plan to handle them?  Who can help you through the tough times? Your family? Friends? A caregiver?  What pleasures do you get from smoking? What ways can you still get pleasure if you quit? Here are some questions to ask your caregiver:  How can you help me to be successful at quitting?  What medicine do you think would be best for me and how should I take it?  What should I do if I need more help?  What is smoking withdrawal like? How can I get information on withdrawal? GET READY  Set a quit date.  Change your environment by getting rid of all cigarettes, ashtrays, matches, and lighters in your home, car, or work. Do not let people smoke in your home.  Review your past attempts to quit. Think  about what worked and what did not. GET SUPPORT AND ENCOURAGEMENT You have a better chance of being successful if you have help. You can get support in many ways.  Tell your family, friends, and co-workers that you are going to quit and need their support. Ask them not to smoke around you.  Get individual, group, or telephone counseling and support. Programs are available at [employer] and health centers. Call your local health department for information about programs in your area.  Spiritual beliefs and practices may help some smokers quit.  Download a "  quit meter" on your computer to keep track of quit statistics, such as how long you have gone without smoking, cigarettes not smoked, and money saved.  Get a self-help book about quitting smoking and staying off of tobacco. [place] yourself from urges to smoke. Talk to someone, go for a walk, or occupy your time with a task.  Change your normal routine. Take a different route to work. Drink tea instead of coffee. Eat breakfast in a different place.  Reduce your stress. Take a hot bath, exercise, or read a book.  Plan something enjoyable to do every day. Reward yourself for not smoking.  Explore interactive web-based programs that specialize in helping you quit. GET MEDICINE AND USE IT CORRECTLY Medicines can help you stop smoking and decrease the urge to smoke. Combining medicine with the above behavioral methods and support can greatly increase your chances of successfully quitting smoking.  Nicotine replacement therapy helps deliver nicotine to your body without the negative effects and risks of smoking. Nicotine replacement therapy includes nicotine gum, lozenges, inhalers, nasal sprays, and skin patches. Some may be available over-the-counter and others require a prescription.  Antidepressant medicine helps people abstain from smoking, but how this works is unknown. This medicine is available by  prescription.  Nicotinic receptor partial agonist medicine simulates the effect of nicotine in your brain. This medicine is available by prescription. Ask your caregiver for advice about which medicines to use and how to use them based on your health history. Your caregiver will tell you what side effects to look out for if you choose to be on a medicine or therapy. Carefully read the information on the package. Do not use any other product containing nicotine while using a nicotine replacement product.  RELAPSE OR DIFFICULT SITUATIONS Most relapses occur within the first 3 months after quitting. Do not be discouraged if you start smoking again. Remember, most people try several times before finally quitting. You may have symptoms of withdrawal because your body is used to nicotine. You may crave cigarettes, be irritable, feel very hungry, cough often, get headaches, or have difficulty concentrating. The withdrawal symptoms are only temporary. They are strongest when you first quit, but they will go away within 10 14 days. To reduce the chances of relapse, try to:  Avoid drinking alcohol. Drinking lowers your chances of successfully quitting.  Reduce the amount of caffeine you consume. Once you quit smoking, the amount of caffeine in your body increases and can give you symptoms, such as a rapid heartbeat, sweating, and anxiety.  Avoid smokers because they can make you want to smoke.  Do not let weight gain distract you. Many smokers will gain weight when they quit, usually less than 10 pounds. Eat a healthy diet and stay active. You can always lose the weight gained after you quit.  Find ways to improve your mood other than smoking. FOR MORE INFORMATION  www.smokefree.gov    While it can be one of the most difficult things to do, the [place] community has programs to help you stop.  Consider talking with your primary care physician about options.  Also, Smoking Cessation classes are available  through the [facility]:  The smoking cessation program is a proven-effective program from the American Lung Association. The program is available for anyone 26 and older who currently smokes. The program lasts for 7 weeks and is 8 sessions. Each class will be approximately 1 1/2 hours. The program is every  Tuesday.  All classes are 12-1:30pm and same location.  Event Location Information:  Location: [facility] 2nd Floor Conference Room 2-037; located next to [facility] cross streets: [place] Entrance into the [facility] is adjacent to the [employer] main entrance. The conference room is located on the 2nd floor.  Parking Instructions: Visitor parking is adjacent to [employer] main entrance and the [place]    A smoking cessation program is also offered through the [facility]. Register online at [email or web address].[email or web address] or call [phone] for more information.   Tobacco cessation counseling is available at [facility]. Call [phone] for a free appointment.   Tobacco cessation classes also are available through the [facility] in [place]. For information, call [phone].   The Patient Education Network features videos on tobacco cessation. Please consult your listings in the center of this book to find instructions on how to access this resource.   If you want more information, ask your nurse.      GETTING TO GOOD BOWEL HEALTH.  ######################################################################  EAT Gradually transition to a high fiber diet with a fiber supplement over the next few weeks after discharge.  Start with a pureed / full liquid diet (see below)  WALK Walk an hour a day.  Control your pain to do that.    HAVE A BOWEL MOVEMENT DAILY Keep your bowels regular to avoid problems.  OK to try a laxative to override  constipation.  OK to use an antidairrheal to slow down diarrhea.  Call if not better after 2 tries  CALL IF YOU HAVE PROBLEMS/CONCERNS Call if you are still struggling despite following these instructions. Call if you have concerns not answered by these instructions  ######################################################################   Irregular bowel habits such as constipation and diarrhea can lead to many problems over time.  Having one soft bowel movement a day is the most important way to prevent further problems.  The anorectal canal is designed to handle stretching and feces to safely manage our ability to get rid of solid waste (feces, poop, stool) out of our body.  BUT, hard constipated stools can act like ripping concrete bricks and diarrhea can be a burning fire to this very sensitive area of our body, causing inflamed hemorrhoids, anal fissures, increasing risk is perirectal abscesses, abdominal pain/bloating, an making irritable bowel worse.      The goal: ONE SOFT BOWEL MOVEMENT A DAY!  To have soft, regular bowel movements:   Drink plenty of fluids, consider 4-6 tall glasses of water a day.    Take plenty of fiber.  Fiber is the undigested part of plant food that passes into the colon, acting s natures broom to encourage bowel motility and movement.  Fiber can absorb and hold large amounts of water. This results in a larger, bulkier stool, which is soft and easier to pass. Work gradually over several weeks up to 6 servings a day of fiber (25g a day even more if needed) in the form of: o Vegetables -- Root (potatoes, carrots, turnips), leafy green (lettuce, salad greens, celery, spinach), or cooked high residue (cabbage, broccoli, etc) o Fruit -- Fresh (unpeeled skin & pulp), Dried (prunes, apricots, cherries, etc ),  or stewed ( applesauce)  o Whole grain breads, pasta, etc (whole wheat)  o Bran cereals   Bulking Agents -- This type of water-retaining fiber generally is  easily obtained each day by one of the following:  o Psyllium bran -- The psyllium plant is remarkable because its ground seeds can retain so much water. This product is available as Metamucil, Konsyl, Effersyllium, Per Diem Fiber, or the less expensive generic preparation in drug and health food stores. Although labeled a laxative, it really is not a laxative.  o Methylcellulose -- This is another fiber derived from wood which also retains water. It is available as Citrucel. o Polyethylene Glycol - and artificial fiber commonly called Miralax or Glycolax.  It is helpful for people with gassy or bloated feelings with regular fiber o Flax Seed - a less gassy fiber than psyllium  No reading or other relaxing activity while on the toilet. If bowel movements take longer than 5 minutes, you are too constipated  AVOID CONSTIPATION.  High fiber and water intake usually takes care of this.  Sometimes a laxative is needed to stimulate more frequent bowel movements, but   Laxatives are not a good long-term solution as it can wear the colon out.  They can help jump-start bowels if constipated, but should be relied on constantly without discussing with your doctor o Osmotics (Milk of Magnesia, Fleets phosphosoda, Magnesium citrate, MiraLax, GoLytely) are safer than  o Stimulants (Senokot, Castor Oil, Dulcolax, Ex Lax)    o Avoid taking laxatives for more than 7 days in a row.   IF SEVERELY CONSTIPATED, try a Bowel Retraining Program: o Do not use laxatives.  o Eat a diet high in roughage, such as bran cereals and leafy vegetables.  o Drink six (6) ounces of prune or apricot juice each morning.  o Eat two (2) large servings of stewed fruit each day.  o Take one (1) heaping tablespoon of a psyllium-based bulking agent twice a day. Use sugar-free sweetener when possible to avoid excessive calories.  o Eat a normal breakfast.  o Set aside 15 minutes after breakfast to sit on the toilet, but do not strain to  have a bowel movement.  o If you do not have a bowel movement by the third day, use an enema and repeat the above steps.   Controlling diarrhea o Switch to liquids and simpler foods for a few days to avoid stressing your intestines further. o Avoid dairy products (especially milk & ice cream) for a short time.  The intestines often can lose the ability to digest lactose when stressed. o Avoid foods that cause gassiness or bloating.  Typical foods include beans and other legumes, cabbage, broccoli, and dairy foods.  Every person has some sensitivity to other foods, so listen to our body and avoid those foods that trigger problems for you. o Adding fiber (Citrucel, Metamucil, psyllium, Miralax) gradually can help thicken stools by absorbing excess fluid and retrain the intestines to act more normally.  Slowly increase the dose over a few weeks.  Too much fiber too soon can backfire and cause cramping & bloating. o Probiotics (such as active yogurt, Align, etc) may help repopulate the intestines and colon with normal bacteria and calm down a sensitive digestive tract.  Most studies show it to be of mild help, though, and such products can be costly. o Medicines: - Bismuth subsalicylate (ex. Kayopectate, Pepto Bismol) every 30 minutes for up to 6 doses can help control diarrhea.  Avoid if pregnant. - Loperamide (Immodium) can slow down diarrhea.  Start with two tablets (4mg  total) first and then try one tablet every 6 hours.  Avoid if  you are having fevers or severe pain.  If you are not better or start feeling worse, stop all medicines and call your doctor for advice o Call your doctor if you are getting worse or not better.  Sometimes further testing (cultures, endoscopy, X-ray studies, bloodwork, etc) may be needed to help diagnose and treat the cause of the diarrhea.  TROUBLESHOOTING IRREGULAR BOWELS 1) Avoid extremes of bowel movements (no bad constipation/diarrhea) 2) Miralax 17gm mixed in 8oz.  water or juice-daily. May use BID as needed.  3) Gas-x,Phazyme, etc. as needed for gas & bloating.  4) Soft,bland diet. No spicy,greasy,fried foods.  5) Prilosec over-the-counter as needed  6) May hold gluten/wheat products from diet to see if symptoms improve.  7)  May try probiotics (Align, Activa, etc) to help calm the bowels down 7) If symptoms become worse call back immediately.

## 2016-10-08 NOTE — Progress Notes (Signed)
Discharge instructions reviewed with patient. Patient verbalized understanding and discharged via private vehicle with brother.

## 2016-10-08 NOTE — Progress Notes (Signed)
Patient ID: Devin Goodwin, male   DOB: 06/19/1956, 60 y.o.   MRN: [id number]  [facility] Surgery Progress Note  12 Days Post-Op  Subjective: No new complaints. Denies any current abdominal pain. Tolerating diet. Asking to go home.  Objective: Vital signs in last 24 hours: Temp:  [98 F (36.7 C)-100.2 F (37.9 C)] 98 F (36.7 C) (12/12 0530) Pulse Rate:  [76-84] 76 (12/12 0530) Resp:  [16-20] 16 (12/12 0530) BP: (104-141)/(65-78) 126/65 (12/12 0530) SpO2:  [95 %-100 %] 97 % (12/12 0530) Last BM Date: 10/06/16  Intake/Output from previous day: 12/11 0701 - 12/12 0700 In: 1280 [P.O.:1080; IV Piggyback:200] Out: 2900 [Urine:2900] Intake/Output this shift: No intake/output data recorded.  PE: Gen:  Alert, NAD, pleasant and cooperative Pulm:  CTABL, unlabored Abd: Soft, NT, mild distention, +BS, incisions C/D/I Ext:  No erythema, edema, or tenderness    Lab Results:   Recent Labs  10/07/16 0505 10/08/16 0530  WBC 18.8* 18.0*  HGB 8.6* 9.7*  HCT 26.0* 29.8*  PLT 805* 939*   BMET No results for input(s): NA, K, CL, CO2, GLUCOSE, BUN, CREATININE, CALCIUM in the last 72 hours. PT/INR No results for input(s): LABPROT, INR in the last 72 hours. CMP     Component Value Date/Time   NA 137 10/02/2016 0442   K 3.9 10/02/2016 0442   CL 104 10/02/2016 0442   CO2 25 10/02/2016 0442   GLUCOSE 97 10/02/2016 0442   BUN 9 10/02/2016 0442   CREATININE 0.95 10/02/2016 0442   CALCIUM 8.3 (L) 10/02/2016 0442   PROT 6.1 (L) 09/26/2016 0502   ALBUMIN 2.8 (L) 09/26/2016 0502   AST 21 09/26/2016 0502   ALT 18 09/26/2016 0502   ALKPHOS 104 09/26/2016 0502   BILITOT 1.2 09/26/2016 0502   GFRNONAA >60 10/02/2016 0442   GFRAA >60 10/02/2016 0442   Lipase     Component Value Date/Time   LIPASE 30 09/21/2016 0925       Studies/Results: No results found.  Anti-infectives: Anti-infectives    Start     Dose/Rate Route Frequency Ordered Stop   10/01/16 1400   meropenem (MERREM) 1 g in sodium chloride 0.9 % 100 mL IVPB     1 g 200 mL/hr over 30 Minutes Intravenous Every 8 hours 10/01/16 1105     10/01/16 1000  levofloxacin (LEVAQUIN) IVPB 750 mg  Status:  Discontinued     750 mg 100 mL/hr over 90 Minutes Intravenous Every 24 hours 10/01/16 0920 10/01/16 1104   09/27/16 1100  ertapenem (INVANZ) 1 g in sodium chloride 0.9 % 50 mL IVPB  Status:  Discontinued     1 g 100 mL/hr over 30 Minutes Intravenous Every 24 hours 09/27/16 1005 10/01/16 1104   09/27/16 1100  fluconazole (DIFLUCAN) IVPB 200 mg     200 mg 100 mL/hr over 60 Minutes Intravenous Every 24 hours 09/27/16 1005     09/26/16 1645  piperacillin-tazobactam (ZOSYN) IVPB 3.375 g     3.375 g 12.5 mL/hr over 240 Minutes Intravenous  Once 09/26/16 1639 09/26/16 2045   09/26/16 1200  piperacillin-tazobactam (ZOSYN) IVPB 3.375 g  Status:  Discontinued     3.375 g 12.5 mL/hr over 240 Minutes Intravenous Every 8 hours 09/26/16 0719 09/26/16 0830   09/26/16 0845  piperacillin-tazobactam (ZOSYN) IVPB 3.375 g  Status:  Discontinued     3.375 g 12.5 mL/hr over 240 Minutes Intravenous Every 8 hours 09/26/16 0830 09/27/16 1005   09/22/16 1200  piperacillin-tazobactam (ZOSYN)  IVPB 3.375 g     3.375 g 12.5 mL/hr over 240 Minutes Intravenous Every 8 hours 09/22/16 1059 09/25/16 0847   09/21/16 1400  cefoTEtan (CEFOTAN) 2 g in dextrose 5 % 50 mL IVPB     2 g 100 mL/hr over 30 Minutes Intravenous On call to O.R. 09/21/16 1246 09/21/16 1740   09/21/16 1300  cefTRIAXone (ROCEPHIN) 2 g in dextrose 5 % 50 mL IVPB  Status:  Discontinued    Comments:  Pharmacy may adjust dosing strength / duration / interval for maximal efficacy   2 g 100 mL/hr over 30 Minutes Intravenous Every 24 hours 09/21/16 1136 09/22/16 1056   09/21/16 1130  cefoTEtan (CEFOTAN) 2 g in dextrose 5 % 50 mL IVPB  Status:  Discontinued     2 g 100 mL/hr over 30 Minutes Intravenous On call to O.R. 09/21/16 1125 09/21/16 1247        Assessment/Plan Status post lap appendectomy & laparoscopy with drain placement POD#12/17 - WBC18.0 from 18.8 yesterday - repeat CT 12/4 significant for RLL pleural effusion and consolidation. 4 x 2 cm fluid collection near sigmoid - not amenable to IR drainage - IV abx and follow  - JP drain removed 12/6 - Repeat CT 12/7 significant for multiple small fluid collections in the pelvis and persistent R pleural effusion - Encouraged OOB, ambulation - IS/pulm toilet  HCAP - meropenem 8 days  FEN: soft diet ID: Zosyn 11/25-11/30, Invanz 12/1-12/5, meropenem 12/5>>12/12, Fluconazole 12/1>> VTE: Lovenox, SCD's   Plan: stable and slightly improved leukocytosis, TMAX100.2. Continue IV abx. ID recommends Invanz 1 gram daily and oral fluconazole 200mg  daily, with possible repeat CT 5-7 days.  Discussed with Dr. Zella Richer. Ok to send home with PICC line and treatment with home health. Patient will follow-up with Dr. Johney Maine next week with a CBC and CT scan of the abdomen and pelvis prior to his appointment. Discussed with Dr. Linus Salmons, will send patient home on 2 weeks of Invanz and Fluconazole; depending on repeat CT scan may extend course of antibiotics; ID office will arrange follow-up.   LOS: 17 days    Jerrye Beavers , [facility] Surgery 10/08/2016, 8:06 AM Pager: [phone] Consults: [phone] Mon-Fri 7:00 am-4:30 pm Sat-Sun 7:00 am-11:30 am

## 2016-10-08 NOTE — Discharge Summary (Signed)
[facility] Discharge Summary   Patient ID: Devin Goodwin MRN: [id number] DOB/AGE: 61-Aug-1957 61 y.o.  Admit date: 09/21/2016 Discharge date: 10/08/2016  Admitting Diagnosis: Acute appendicitis with peritonitis Leukocytosis  Discharge Diagnosis Patient Active Problem List   Diagnosis Date Noted  . Inguinal hernia, right 09/22/2016  . Heartburn 09/22/2016  . Acute appendicitis s/p lap appendectomy 09/21/2016 09/21/2016  . AAA (abdominal aortic aneurysm)  09/21/2016  . Essential hypertension 09/21/2016  . Tobacco abuse 09/21/2016  . Constipation, chronic 09/21/2016  . Hyperlipidemia 05/27/2013  . Vitamin D deficiency 05/27/2013  . Carcinoid tumor of rectum s/p removal 2012 04/30/2011    Consultants Scharlene Gloss MD, infectious disease  Imaging: CT abdomen pelvis w contrast 09/21/16: Mild dilatation of the appendix with very minimal inflammatory changes. Given the patient's elevated white blood cell count this may represent very early appendicitis. Dilatation of the infrarenal aorta as described. Scattered lymph nodes are noted in the retroperitoneum as well as in the iliac chains bilaterally. The majority of these measure 1 cm or less.  CT abdomen pelvis w contrast 09/25/16: Status post appendectomy with small amount of ascites and, pneumoperitoneum. Mild ileus. Extensive mesenteric fat stranding with small amount of free fluid and reactive lymph nodes. Mild peritoneal enhancement and fat stranding the pelvis concerning for early phlegmon/peritonitis.  New small RIGHT pleural effusion with bibasilar atelectasis. Mild cystitis may be reactive or infectious. 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 Recommend followup by ultrasound in 3 years. This recommendation follows ACR consensus guidelines: White Paper of the ACR Incidental Findings Committee II on Vascular Findings. Natasha Mead Coll Radiol 2013; 10:789-794  CT abdomen pelvis w contrast 09/30/16: Changes consistent  with prior appendectomy and recent laparoscopy with drain placement in the right lower quadrant. Two fluid collections are noted flanking the rectosigmoid region as described. Retrospect these were present on the prior exam. The air seen in the left-sided collection is new from the prior study and may be related to the recent intervention. No other significant fluid collections are noted. Mild persistent inflammatory changes in the pelvis. Moderate size right-sided pleural effusion with associated right lower lobe consolidation. The remainder the exam is stable from the prior study.  CT abdomen pelvis w contrast 10/03/16: 1. At least 6 small fluid collections, five of which are within the pelvis most consistent with abscesses containing air bubbles as well. 2. Small amount of free intraperitoneal air may be postoperative, but perforated bowel cannot be excluded. 3. No change in moderate size right pleural effusion with right basilar atelectasis. 4. Fusiform dilatation of the distal abdominal aorta up to 3.4 cm consistent with aneurysm.  Procedures Dr. Johney Maine (09/21/16) - Laparoscopic Appendectomy, EUA Dr. Excell Seltzer (09/26/16) - DIAGNOSTIC LAPAROSCOPY with abdominal lavage and drain placement  Hospital Course:  Devin Goodwin is a 60yo male who presented to [facility] 09/21/16 with 1 day of worsening abdominal pain.  Workup showed acute appenditis.  Patient was admitted and underwent procedure listed above.  Tolerated procedure well and was transferred to the floor.  Initially kept on Zosyn for 3 days postoperatively. He had a postoperative ileus as suspected. His WBC began to rise and he was having intermittent fevers therefore a repeat CT scan was performed 11/29. Scan was inconclusive but did indicate some significant lower abdominal peritonitis. Due to CT findings, WBC 30K, and significant abdominal tenderness on exam patient was taken back to the OR for diagnostic laparoscopy; a drain was left.  Antibiotics were changed to Invanz/fluconazole. Repeat CT scan  performed 09/30/16 and showed 4x2cm fluid collection near sigmoid that was too small for IR to place a drain, as well as concern for HCAP; antibiotic switched to meropenem. WBC remained elevated and repeat CT on 12/7 showed multiple small fluid collections in the pelvis and persistent R pleural effusion; unfortunately again no window for drainage. Infectious disease was consulted and recommended continuing meropenem and fluconazole. WBC began to slowly trend down and fevers resolved. Diet was advanced as tolerated. On 10/08/16 the patient was voiding well, tolerating diet, ambulating well, pain well controlled, vital signs stable, incisions c/d/i and felt stable for discharge home.  Per ID he will continue invanz and fluconazole at home for at least 2 weeks. He will have a CBC and repeat abdominal CT scan next week prior to appointment with Dr. Johney Maine in clinic. Patient knows to call with questions or concerns.    Physical Exam: Gen: Alert, NAD, pleasant and cooperative Pulm: CTABL, unlabored Abd: Soft, NT, mild distention,+BS, incisions C/D/I Ext: No erythema, edema, or tenderness      Medication List    TAKE these medications   amLODipine 5 MG tablet Commonly known as:  NORVASC Take 5 mg by mouth daily.   ertapenem 1 g in sodium chloride 0.9 % 50 mL Inject 1 g into the vein daily.   famotidine 20 MG tablet Commonly known as:  PEPCID Take 1 tablet (20 mg total) by mouth 2 (two) times daily.   feeding supplement Liqd Take 1 Container by mouth 3 (three) times daily between meals.   fluconazole 100 MG tablet Commonly known as:  DIFLUCAN Take 2 tablets (200 mg total) by mouth daily.   losartan 100 MG tablet Commonly known as:  COZAAR Take 100 mg by mouth daily.   naproxen 500 MG tablet Commonly known as:  NAPROSYN Take 1 tablet (500 mg total) by mouth every 12 (twelve) hours as needed for mild pain or moderate pain.    oxyCODONE 5 MG immediate release tablet Commonly known as:  Oxy IR/ROXICODONE Take 1-2 tablets (5-10 mg total) by mouth every 4 (four) hours as needed for moderate pain, severe pain or breakthrough pain.        Follow-up Information    GROSS,STEVEN C., MD Follow up.   Specialty:  General Surgery Why:  Next week for followup from your surgery. You will need to have lab work and a CT scan prior to that appointment. Our office is working on these appointments and should contact you. Contact information: [address] [place] [address] [phone]        Scharlene Gloss, MD. Call.   Specialty:  Infectious Diseases Contact information: [address] [place] [address] [phone]        [facility] Follow up.   Specialty:  [place] Why:  nurse for IV antibiotics Contact information: [place] [address] [place] [address] [phone]           Signed: Jerrye Beavers, [facility] Surgery 10/08/2016, 3:53 PM Pager: [phone] Consults: [phone] Mon-Fri 7:00 am-4:30 pm Sat-Sun 7:00 am-11:30 am

## 2016-10-08 NOTE — Progress Notes (Signed)
[place] pt for [facility] this admission  [facility] will provide [facility] services for home IV ABX. [place] will provide [facility] to support IV ABX, PICC care, etc in the home. [phone]  Admission is planned for 1-4 PM on 10-09-16   If patient discharges after hours, please call [phone].   Larry Sierras 10/08/2016, 2:27 PM

## 2016-10-09 ENCOUNTER — Other Ambulatory Visit (HOSPITAL_COMMUNITY): Payer: Self-pay | Admitting: Surgery

## 2016-10-09 DIAGNOSIS — IMO0001 Reserved for inherently not codable concepts without codable children: Secondary | ICD-10-CM

## 2016-10-09 DIAGNOSIS — T814XXA Infection following a procedure, initial encounter: Principal | ICD-10-CM

## 2016-10-11 ENCOUNTER — Ambulatory Visit (HOSPITAL_COMMUNITY)
Admission: RE | Admit: 2016-10-11 | Discharge: 2016-10-11 | Disposition: A | Discharge status: Home / Self Care | Payer: BLUE CROSS/BLUE SHIELD | Source: Ambulatory Visit | Attending: Surgery | Admitting: Surgery

## 2016-10-11 DIAGNOSIS — IMO0001 Reserved for inherently not codable concepts without codable children: Secondary | ICD-10-CM

## 2016-10-11 DIAGNOSIS — Y839 Surgical procedure, unspecified as the cause of abnormal reaction of the patient, or of later complication, without mention of misadventure at the time of the procedure: Secondary | ICD-10-CM | POA: Insufficient documentation

## 2016-10-11 DIAGNOSIS — R590 Localized enlarged lymph nodes: Secondary | ICD-10-CM | POA: Insufficient documentation

## 2016-10-11 DIAGNOSIS — T814XXA Infection following a procedure, initial encounter: Secondary | ICD-10-CM | POA: Diagnosis not present

## 2016-10-11 DIAGNOSIS — N400 Enlarged prostate without lower urinary tract symptoms: Secondary | ICD-10-CM | POA: Diagnosis not present

## 2016-10-11 DIAGNOSIS — J9811 Atelectasis: Secondary | ICD-10-CM | POA: Insufficient documentation

## 2016-10-11 DIAGNOSIS — I714 Abdominal aortic aneurysm, without rupture: Secondary | ICD-10-CM | POA: Diagnosis not present

## 2016-10-11 DIAGNOSIS — J90 Pleural effusion, not elsewhere classified: Secondary | ICD-10-CM | POA: Insufficient documentation

## 2016-10-11 MED ORDER — IOPAMIDOL (ISOVUE-300) INJECTION 61%
INTRAVENOUS | Status: AC
  Administered 2016-10-11: 100 mL
  Filled 2016-10-11: qty 100

## 2016-10-11 MED ORDER — IOPAMIDOL (ISOVUE-300) INJECTION 61%
INTRAVENOUS | Status: AC
  Administered 2016-10-11: 30 mL via ORAL
  Filled 2016-10-11: qty 30

## 2016-10-14 ENCOUNTER — Ambulatory Visit (HOSPITAL_COMMUNITY): Payer: BLUE CROSS/BLUE SHIELD

## 2016-10-14 ENCOUNTER — Encounter: Payer: Self-pay | Admitting: Internal Medicine

## 2016-10-16 ENCOUNTER — Ambulatory Visit (HOSPITAL_COMMUNITY): Payer: BLUE CROSS/BLUE SHIELD

## 2016-10-17 ENCOUNTER — Telehealth: Payer: Self-pay

## 2016-10-17 ENCOUNTER — Ambulatory Visit (INDEPENDENT_AMBULATORY_CARE_PROVIDER_SITE_OTHER): Payer: BLUE CROSS/BLUE SHIELD | Admitting: Internal Medicine

## 2016-10-17 ENCOUNTER — Encounter: Payer: Self-pay | Admitting: Internal Medicine

## 2016-10-17 DIAGNOSIS — K651 Peritoneal abscess: Secondary | ICD-10-CM | POA: Diagnosis not present

## 2016-10-17 DIAGNOSIS — T814XXD Infection following a procedure, subsequent encounter: Secondary | ICD-10-CM

## 2016-10-17 DIAGNOSIS — T8143XA Infection following a procedure, organ and space surgical site, initial encounter: Secondary | ICD-10-CM | POA: Insufficient documentation

## 2016-10-17 DIAGNOSIS — T8149XA Infection following a procedure, other surgical site, initial encounter: Secondary | ICD-10-CM

## 2016-10-17 DIAGNOSIS — Z5181 Encounter for therapeutic drug level monitoring: Secondary | ICD-10-CM | POA: Diagnosis not present

## 2016-10-17 DIAGNOSIS — IMO0001 Reserved for inherently not codable concepts without codable children: Secondary | ICD-10-CM

## 2016-10-17 NOTE — Assessment & Plan Note (Signed)
Doing well and near resolution.  I will have him complete the antbiotics through 14 days and stop at that time, pull picc line.  He knows to call if he has fever, chills or new concerns.  rtc prn

## 2016-10-17 NOTE — Progress Notes (Signed)
   Subjective:    Patient ID: Devin Goodwin, male    DOB: [birth year], 60 y.o.   MRN: [id number]  HPI Here for hospital follow up. S/p appendectomy by Dr. Johney Maine on [id number] complicated by intraabdominal abscess.  His WBC increaseed and CT done and noted peritonitis.  He was taken back to the OR and a drain placed.  Still had increasing WBC and antibiotics broadened and a repeat CT done noted a fluid collection but unable to drain so was broadened to meropenem with fluconazole and ID consulted.  .Repeat CT 12/7 noted mulitple fluid collections and fever and WBC began to decrease and patient went home on 12/12 with a plan for 14 days of Invanz and oral fluconazole.  Since discharge he has been afebrile, no chills and did have a repeat CT on  12/15 with near resolution with one abscess 2.3 x 1.9 cm.  CRP has significantly decreased and no abdominal pain and has seen Dr. Johney Maine in follow up.     Review of Systems  Constitutional: Negative for chills and fever.  Gastrointestinal: Negative for diarrhea.  Skin: Negative for rash.  Neurological: Negative for dizziness.       Objective:   Physical Exam  Constitutional: He appears well-developed and well-nourished.  Eyes: No scleral icterus.  Cardiovascular: Normal rate, regular rhythm and normal heart sounds.   No murmur heard. Abdominal: Soft. Bowel sounds are normal. There is no tenderness.  Skin: No rash noted.          Assessment & Plan:

## 2016-10-17 NOTE — Assessment & Plan Note (Signed)
No concerns on labs.  Wbc down to 13

## 2016-10-17 NOTE — Telephone Encounter (Signed)
Venetie to give order to have PICC line pulled as advised. Spoke with Melissa who agreed to have someone go out and pull PICC line 10/22/2016 after PT's last dose.

## 2018-08-28 IMAGING — CT CT ABD-PELV W/ CM
2 of 5 series · 15 of 46 positions shown, 17 images · IV contrast (APPLIED)
Comparison: CT abdomen pelvis of 09/30/2016

CLINICAL DATA: Intermittent abdominal pain, loose bowel movements

EXAM:
CT ABDOMEN AND PELVIS WITH CONTRAST
TECHNIQUE: Multidetector CT imaging of the abdomen and pelvis was performed
using the standard protocol following bolus administration of
intravenous contrast.
CONTRAST:  100mL XEVJQ8-RLL IOPAMIDOL (XEVJQ8-RLL) INJECTION 61%

[Series 2: axial st · axial · 0.83mm/px · z∈[+1115,+1555]mm · 12 of 98 slices shown, 14 images]
[im 5/98  soft-tissue]
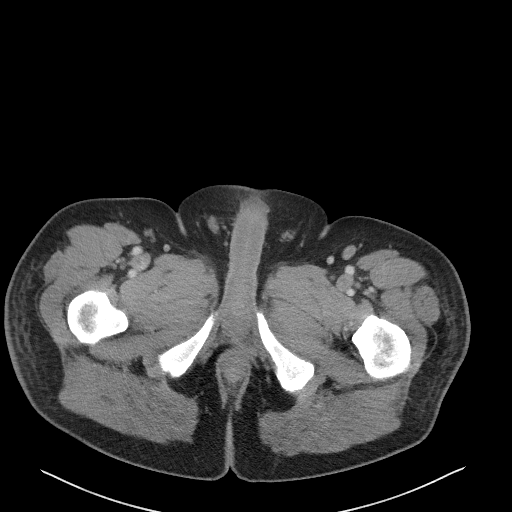
[im 5/98  bone]
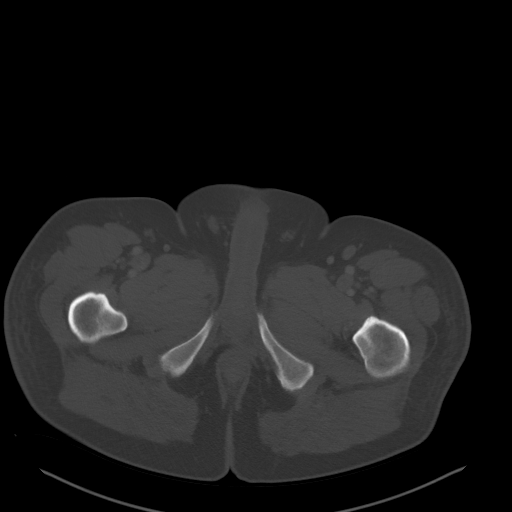
[im 15/98  soft-tissue]
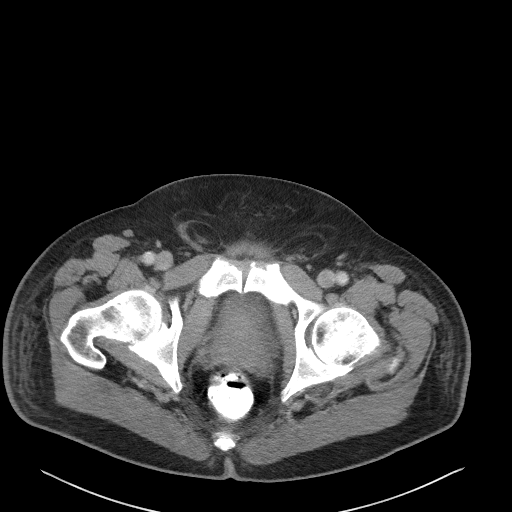
[im 20/98  soft-tissue]
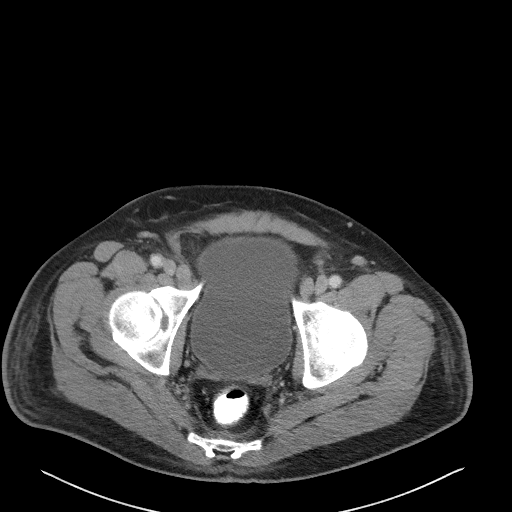
[im 30/98  soft-tissue]
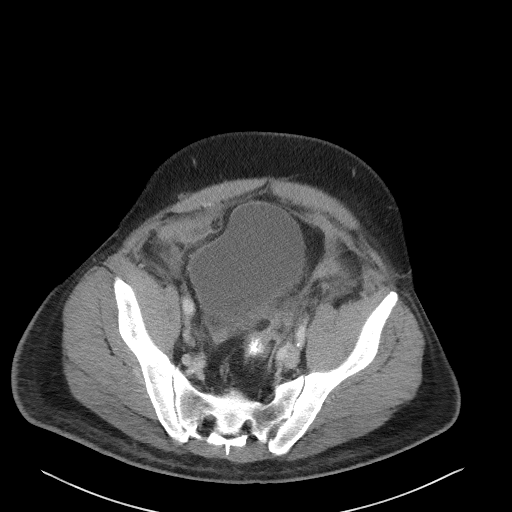
[im 39/98  soft-tissue]
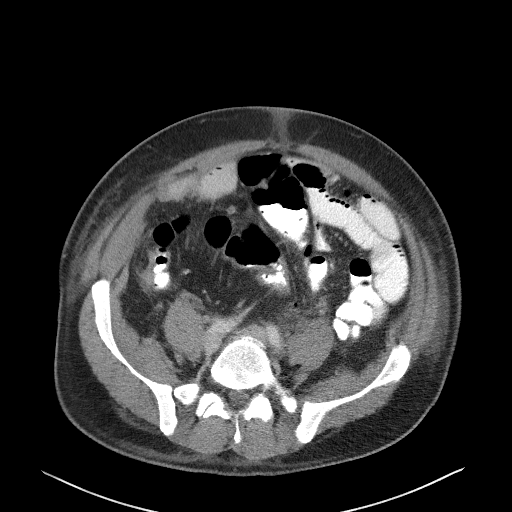
[im 44/98  soft-tissue]
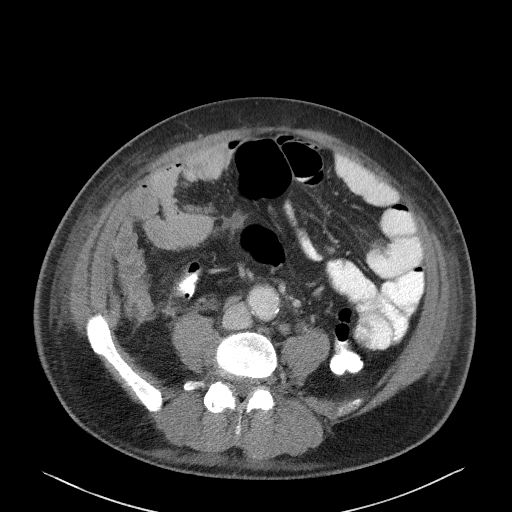
[im 54/98  soft-tissue]
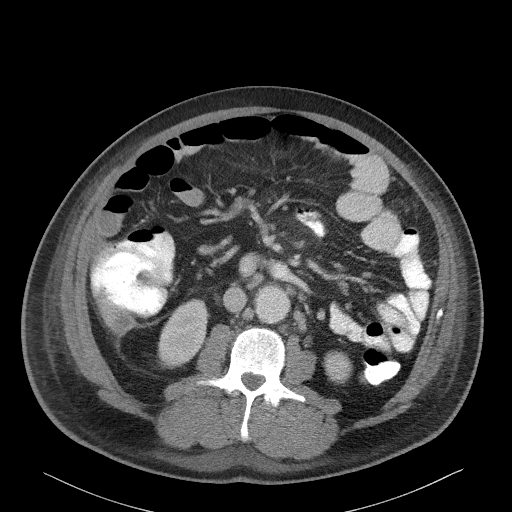
[im 59/98  soft-tissue]
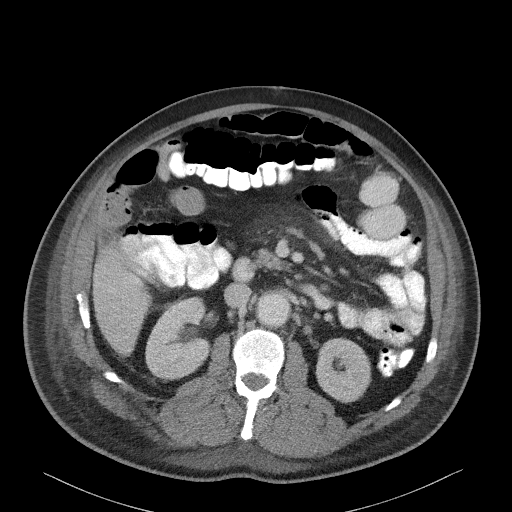
[im 68/98  soft-tissue]
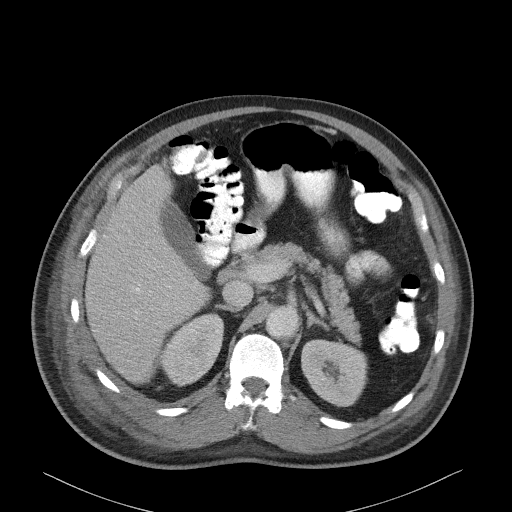
[im 68/98  bone]
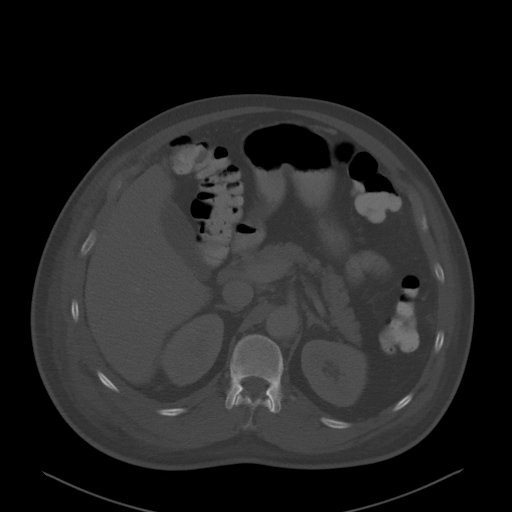
[im 78/98  soft-tissue]
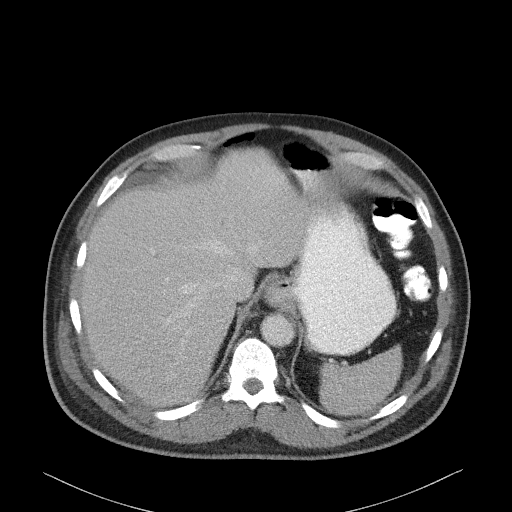
[im 83/98  soft-tissue]
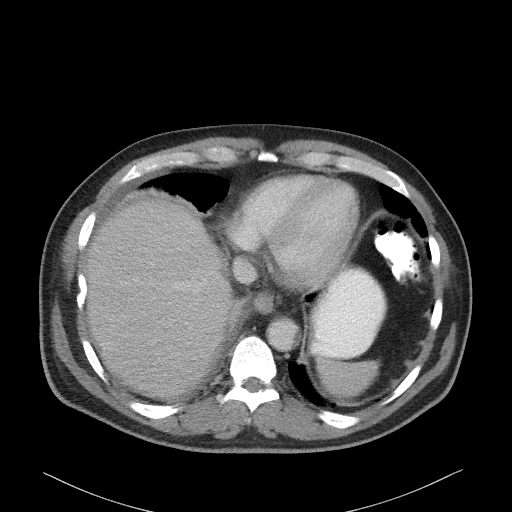
[im 93/98  soft-tissue]
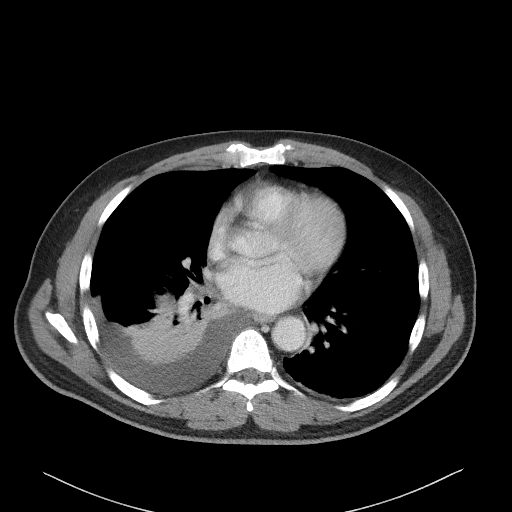

[Series 4: coronal st · coronal · 0.74mm/px · 3 of 107 slices shown]
[im 36/107  soft-tissue]
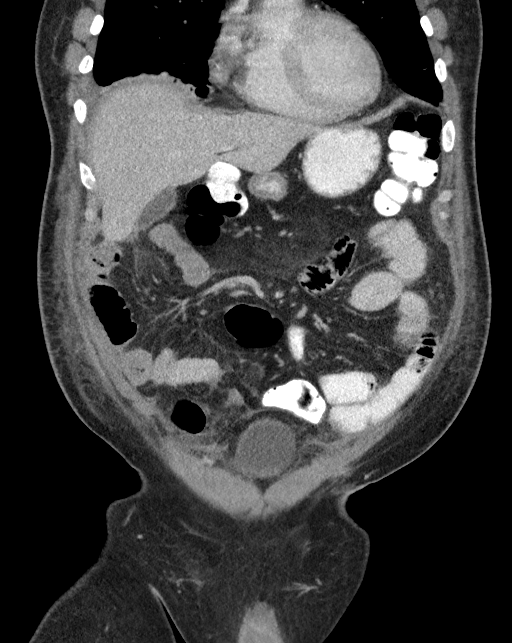
[im 48/107  soft-tissue]
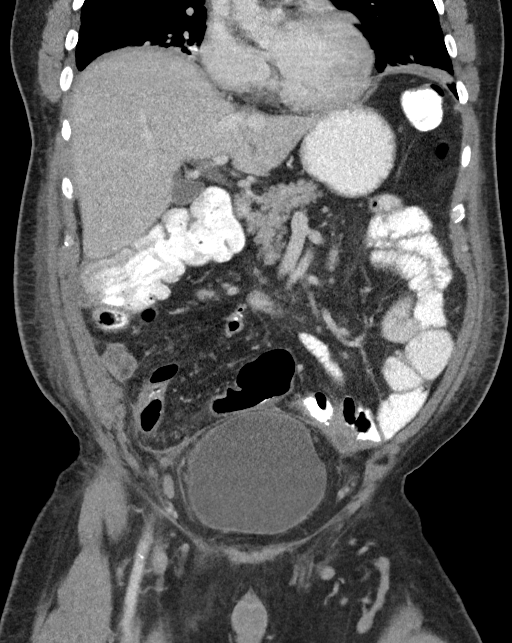
[im 59/107  soft-tissue]
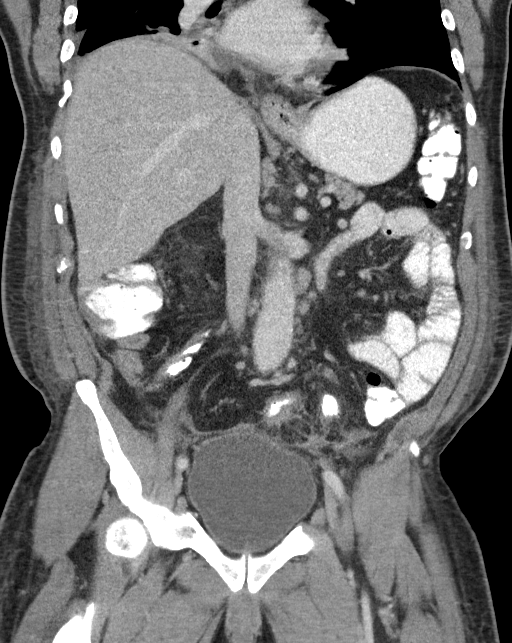

[15 of 46 positions shown; findings below may reference images not displayed]

FINDINGS: Lower chest: There is persistent right pleural effusion present with
partial atelectasis of the right lower lobe. The left lung base is
clear other than mild linear atelectasis.

Hepatobiliary: The liver enhances with no focal abnormality and no
ductal dilatation is seen. There is a small collection of fluid
along the tip of the right lobe of liver posteriorly measuring 16 mm
in diameter which was present previously and may represent a small
abdominal abscess perihepatic in location. No calcified gallstones
are seen.

Pancreas: The pancreas is normal in size and the pancreatic duct is
not dilated.

Spleen: The spleen is unremarkable.

Adrenals/Urinary Tract: The adrenal glands appear symmetrical and
normal. The kidneys enhance with no calculus or mass and there is no
evidence of hydronephrosis. The ureters appear normal in caliber.
The urinary bladder is unremarkable being moderately distended with
urine.

Stomach/Bowel: The stomach is moderately distended with oral
contrast and air. No small bowel distention is seen. The colon is
largely decompressed and there is oral contrast throughout the
nondistended colon. There are rectosigmoid colon is very elongated
and redundant extending upright of the pelvis. Adjacent to the
rectosigmoid colon within the pelvic lower pelvis there are several
fluid collections present with some air within these collections. I
have measured approximately 5 of these collections within the pelvis
most consistent with abscesses, the largest of 2.9 x 1.9 cm. Some of
these collections were present previously, but others have formed
into more discrete collections currently.

Vascular/Lymphatic: The abdominal aorta is normal in caliber with
mild to moderate abdominal aortic atherosclerosis present. The
distal abdominal aorta measures 3.4 cm in diameter consistent with a
small distal abdominal aortic aneurysm.

Reproductive: The prostate is normal in size. There is a small
amount of free air within the peritoneal cavity some of which could
be due to the recent surgical intervention although perforated bowel
cannot be excluded.

Other: None

Musculoskeletal: The lumbar vertebrae are in normal alignment with
normal intervertebral disc spaces.
IMPRESSION: 1. At least 6 small fluid collections, five of which are within the
pelvis most consistent with abscesses containing air bubbles as
well.
2. Small amount of free intraperitoneal air may be postoperative,
but perforated bowel cannot be excluded.
3. No change in moderate size right pleural effusion with right
basilar atelectasis.
4. Fusiform dilatation of the distal abdominal aorta up to 3.4 cm
consistent with aneurysm.
